# Patient Record
Sex: Female | Born: 1960 | Race: White | Hispanic: No | Marital: Married | Smoking: Never smoker
Health system: Southern US, Community
[De-identification: ages and names within clinical notes are randomized; demographics above are authoritative.]

## PROBLEM LIST (undated history)

## (undated) DIAGNOSIS — J45990 Exercise induced bronchospasm: Secondary | ICD-10-CM

## (undated) DIAGNOSIS — J329 Chronic sinusitis, unspecified: Secondary | ICD-10-CM

## (undated) DIAGNOSIS — J302 Other seasonal allergic rhinitis: Secondary | ICD-10-CM

## (undated) DIAGNOSIS — G43909 Migraine, unspecified, not intractable, without status migrainosus: Secondary | ICD-10-CM

## (undated) DIAGNOSIS — T7840XA Allergy, unspecified, initial encounter: Secondary | ICD-10-CM

## (undated) HISTORY — DX: Allergy, unspecified, initial encounter: T78.40XA

## (undated) HISTORY — DX: Exercise induced bronchospasm: J45.990

## (undated) HISTORY — DX: Chronic sinusitis, unspecified: J32.9

## (undated) HISTORY — DX: Other seasonal allergic rhinitis: J30.2

## (undated) HISTORY — DX: Migraine, unspecified, not intractable, without status migrainosus: G43.909

---

## 1989-05-02 HISTORY — PX: GUM SURGERY: SHX658

## 1995-05-03 HISTORY — PX: TUBAL LIGATION: SHX77

## 2009-10-19 ENCOUNTER — Ambulatory Visit: Payer: Self-pay | Admitting: Family Medicine

## 2010-12-15 ENCOUNTER — Ambulatory Visit: Payer: Self-pay | Admitting: Family Medicine

## 2014-11-12 DIAGNOSIS — D239 Other benign neoplasm of skin, unspecified: Secondary | ICD-10-CM

## 2014-11-12 HISTORY — DX: Other benign neoplasm of skin, unspecified: D23.9

## 2017-10-27 ENCOUNTER — Other Ambulatory Visit: Payer: Self-pay | Admitting: *Deleted

## 2017-10-27 DIAGNOSIS — Z1231 Encounter for screening mammogram for malignant neoplasm of breast: Secondary | ICD-10-CM

## 2017-11-14 ENCOUNTER — Encounter: Payer: Self-pay | Admitting: Radiology

## 2017-11-14 ENCOUNTER — Ambulatory Visit
Admission: RE | Admit: 2017-11-14 | Discharge: 2017-11-14 | Disposition: A | Discharge status: Home / Self Care | Source: Ambulatory Visit | Attending: *Deleted | Admitting: *Deleted

## 2017-11-14 DIAGNOSIS — Z1231 Encounter for screening mammogram for malignant neoplasm of breast: Secondary | ICD-10-CM | POA: Diagnosis present

## 2018-08-06 ENCOUNTER — Ambulatory Visit: Admitting: Primary Care

## 2018-08-22 ENCOUNTER — Telehealth: Payer: Self-pay | Admitting: Primary Care

## 2018-08-22 NOTE — Telephone Encounter (Signed)
Called to discuss new patient appointment. Left voicemail asking patient to call office. If she calls back, she can be transferred to me.

## 2018-08-23 ENCOUNTER — Ambulatory Visit

## 2018-08-24 ENCOUNTER — Telehealth: Payer: Self-pay | Admitting: Radiology

## 2018-08-24 NOTE — Telephone Encounter (Signed)
LVM for pt to call for appt information, drive up and cvid screen

## 2018-08-28 ENCOUNTER — Ambulatory Visit

## 2018-08-28 ENCOUNTER — Encounter: Payer: Self-pay | Admitting: Primary Care

## 2018-08-28 ENCOUNTER — Ambulatory Visit (INDEPENDENT_AMBULATORY_CARE_PROVIDER_SITE_OTHER): Admitting: Primary Care

## 2018-08-28 DIAGNOSIS — J4520 Mild intermittent asthma, uncomplicated: Secondary | ICD-10-CM | POA: Diagnosis not present

## 2018-08-28 DIAGNOSIS — J329 Chronic sinusitis, unspecified: Secondary | ICD-10-CM | POA: Insufficient documentation

## 2018-08-28 DIAGNOSIS — J45909 Unspecified asthma, uncomplicated: Secondary | ICD-10-CM | POA: Insufficient documentation

## 2018-08-28 NOTE — Assessment & Plan Note (Signed)
Predominantly exercise induced. Infrequent use of her inhaler outside of exercise. Appears well.  Continue to monitor.

## 2018-08-28 NOTE — Patient Instructions (Signed)
Please schedule a physical with me for later this Summer. You may also schedule a lab only appointment 3-4 days prior. We will discuss your lab results in detail during your physical.  It was a pleasure to meet you today! Please don't hesitate to call or message me with any questions. Welcome to [facility]!

## 2018-08-28 NOTE — Assessment & Plan Note (Signed)
Overall improved over time, now typically gets two infections annually. Following with ENT and compliant to Qnasl.

## 2018-08-28 NOTE — Progress Notes (Signed)
Subjective:    Patient ID: Kelli Snyder, female    DOB: November 14, 1960, 58 y.o.   MRN: [id number]  HPI  Virtual Visit via Video Note  I connected with Kelli Snyder on 08/28/18 at  3:20 PM EDT by a video enabled telemedicine application and verified that I am speaking with the correct person using two identifiers.   I discussed the limitations of evaluation and management by telemedicine and the availability of in person appointments. The patient expressed understanding and agreed to proceed. She is at home and I am in the office.  History of Present Illness:  Kelli Snyder is a 58 year old female who presents today to establish care and discuss the problems mentioned below. Will obtain/review records.  1) Asthma/Seasonal Allergies: Currently managed on albuterol HFA PRN, QNasl 80 mcg. Asthma is exercise induced mostly, specifically in really hot or cold environments.   2) Recurrent Sinusitis: Currently following with ENT and is managed on Qnasal 80 mcg with significant improvement. Her last sinus infection was several months ago which typically occur twice annually. She cannot take antihistamines as they cause the reverse effect.   Observations/Objective:  Alert and oriented. Appears well. No distress. Mood appropriate.   Assessment and Plan:  See problem based charting.  Follow Up Instructions:  Please schedule a physical with me for later this Summer. You may also schedule a lab only appointment 3-4 days prior. We will discuss your lab results in detail during your physical.  It was a pleasure to meet you today! Please don't hesitate to call or message me with any questions. Welcome to [facility]!    I discussed the assessment and treatment plan with the patient. The patient was provided an opportunity to ask questions and all were answered. The patient agreed with the plan and demonstrated an understanding of the instructions.   The patient was advised to call back or seek  an in-person evaluation if the symptoms worsen or if the condition fails to improve as anticipated.     [name], NP    Review of Systems  HENT: Negative for sinus pressure.   Respiratory: Negative for shortness of breath.   Cardiovascular: Negative for chest pain.  Allergic/Immunologic: Positive for environmental allergies.  Neurological: Negative for headaches.       Past Medical History:  Diagnosis Date  . Chronic seasonal allergic rhinitis   . Chronic sinusitis   . Exercise-induced asthma   . Migraines      Social History   Socioeconomic History  . Marital status: Married    Spouse name: Not on file  . Number of children: Not on file  . Years of education: Not on file  . Highest education level: Not on file  Occupational History  . Not on file  Social Needs  . Financial resource strain: Not on file  . Food insecurity:    Worry: Not on file    Inability: Not on file  . Transportation needs:    Medical: Not on file    Non-medical: Not on file  Tobacco Use  . Smoking status: Never Smoker  . Smokeless tobacco: Never Used  Substance and Sexual Activity  . Alcohol use: Yes  . Drug use: Not on file  . Sexual activity: Not on file  Lifestyle  . Physical activity:    Days per week: Not on file    Minutes per session: Not on file  . Stress: Not on file  Relationships  . Social connections:  Talks on phone: Not on file    Gets together: Not on file    Attends religious service: Not on file    Active member of club or organization: Not on file    Attends meetings of clubs or organizations: Not on file    Relationship status: Not on file  . Intimate partner violence:    Fear of current or ex partner: Not on file    Emotionally abused: Not on file    Physically abused: Not on file    Forced sexual activity: Not on file  Other Topics Concern  . 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    Married.   2 children.    Past Surgical History:  Procedure Laterality Date  . GUM SURGERY  1991  . TUBAL LIGATION  1997    Family History  Problem Relation Age of Onset  . Breast cancer Mother 59       On HRT prior to diagnosis   . Hyperlipidemia Mother   . Melanoma Mother   . Hypertension Paternal Grandmother   . Cancer Paternal Grandfather     Allergies  Allergen Reactions  . Sulfa Antibiotics Swelling    Swelling and itching of lips, denies breathing problems Swelling and itching of lips, denies breathing problems     Current Outpatient Medications on File Prior to Visit  Medication Sig Dispense Refill  . ASHWAGANDHA PO Take by mouth.    . diphenhydrAMINE HCl, Sleep, (ZZZQUIL PO) Take by mouth.    . Epinastine HCl 0.05 % ophthalmic solution Place 1 drop into both eyes as needed (Allergies).     . MELATONIN PO Take by mouth.    . Multiple Vitamin (MULTIVITAMIN) tablet Take 1 tablet by mouth daily.     [name] PROAIR HFA 108 (90 Base) MCG/ACT inhaler Inhale 1-2 puffs into the lungs every 6 (six) hours as needed.     Norvel Richards 80 MCG/ACT AERS Place 1 spray into the nose daily.      No current facility-administered medications on file prior to visit.     BP 124/76   Pulse 88   Temp 98.4 F (36.9 C) (Tympanic)   Ht 5\' 4"  (1.626 m)   Wt 120 lb 5 oz (54.6 kg)   SpO2 97%   BMI 20.65 kg/m    Objective:   Physical Exam  Constitutional: She is oriented to person, place, and time. She appears well-nourished.  Neck: Neck supple.  Respiratory: Effort normal.  Neurological: She is alert and oriented to person, place, and time.  Skin: Skin is dry.  Psychiatric: She has a normal mood and affect.           Assessment & Plan:

## 2018-09-18 ENCOUNTER — Ambulatory Visit (INDEPENDENT_AMBULATORY_CARE_PROVIDER_SITE_OTHER): Admitting: Primary Care

## 2018-09-18 ENCOUNTER — Ambulatory Visit: Payer: Self-pay

## 2018-09-18 ENCOUNTER — Encounter: Payer: Self-pay | Admitting: Primary Care

## 2018-09-18 DIAGNOSIS — R21 Rash and other nonspecific skin eruption: Secondary | ICD-10-CM | POA: Insufficient documentation

## 2018-09-18 NOTE — Assessment & Plan Note (Signed)
Earlier symptoms could have been Covid-19, hard to tell. Given that these symptoms have resolved I wouldn't recommend Covid-19 testing at this point.  She would be an interesting candidate for antibody testing in the future but not now as antibody testing isn't specific for Covid-19. Discussed to notify me if she notices the rash/brusing return to her foot, also if she developed symptoms of Covid-19.

## 2018-09-18 NOTE — Patient Instructions (Signed)
Please notify me if you notice changes to your foot as discuss.  Also notify me if you develop fevers, cough, shortness of breath, loss of taste/smell.  It was a pleasure to see you today! [name], NP-C

## 2018-09-18 NOTE — Telephone Encounter (Signed)
Attempted to contact pt to discuss symptoms; no answer; will route to office for final disposition.

## 2018-09-18 NOTE — Progress Notes (Signed)
Subjective:    Patient ID: Kelli Snyder, female    DOB: [birth year], 58 y.o.   MRN: [id number]  HPI  Virtual Visit via Video Note  I connected with [name] on 09/18/18 at  2:20 PM EDT by a video enabled telemedicine application and verified that I am speaking with the correct person using two identifiers.  Location: Patient: Home Provider: Office   I discussed the limitations of evaluation and management by telemedicine and the availability of in person appointments. The patient expressed understanding and agreed to proceed.  History of Present Illness:  Kelli Snyder is a 58 year old female with a history of asthma and chronic recurrent sinusitis who presents today to discuss potential Covid-19 symptoms.  In early May she began with a mild headache that lasted for a few days, then she developed diarrhea which lasted for several days, and then began to notice a rash to her right dorsal foot. The rash was red and itchy for a few days, then changed into dark bruising like color. Her rash has since nearly resolved as well as her headaches and diarrhea.   She denies fevers, shortness of breath, cough, injury/trauma to her foot, insect bites. She has not been out of her home much, only to get essential items.    Observations/Objective:  Alert and oriented. Appears well, not sickly. No distress. No cough. Speaking in complete sentences. No bruising noted to dorsal foot.  Assessment and Plan:  Earlier symptoms could have been Covid-19, hard to tell. Given that these symptoms have resolved I wouldn't recommend Covid-19 testing at this point.  She would be an interesting candidate for antibody testing in the future but not now as antibody testing isn't specific for Covid-19. Discussed to notify me if she notices the rash/brusing return to her foot, also if she developed symptoms of Covid-19.  Follow Up Instructions:  Please notify me if you notice changes to your foot as  discuss.  Also notify me if you develop fevers, cough, shortness of breath, loss of taste/smell.  It was a pleasure to see you today! [name], NP-C    I discussed the assessment and treatment plan with the patient. The patient was provided an opportunity to ask questions and all were answered. The patient agreed with the plan and demonstrated an understanding of the instructions.   The patient was advised to call back or seek an in-person evaluation if the symptoms worsen or if the condition fails to improve as anticipated.     [name], NP    Review of Systems  Gastrointestinal: Positive for diarrhea.  Skin: Positive for color change and rash. Negative for wound.  Neurological: Positive for headaches.       Past Medical History:  Diagnosis Date   Chronic seasonal allergic rhinitis    Chronic sinusitis    Exercise-induced asthma    Migraines      Social History   Socioeconomic History   Marital status: Married    Spouse name: Not on file   Number of children: Not on file   Years of education: Not on file   Highest education level: Not on file  Occupational History   Not on file  Social Needs   Financial resource strain: Not on file   Food insecurity:    Worry: Not on file    Inability: Not on file   Transportation needs:    Medical: Not on file    Non-medical: Not on file  Tobacco Use   Smoking status: Never Smoker   Smokeless tobacco: Never Used  Substance and Sexual Activity   Alcohol use: Yes   Drug use: Not on file   Sexual activity: Not on file  Lifestyle   Physical activity:    Days per week: Not on file    Minutes per session: Not on file   Stress: Not on file  Relationships   Social connections:    Talks on phone: Not on file    Gets together: Not on file    Attends religious service: Not on file    Active member of club or organization: Not on file    Attends meetings of clubs or organizations: Not on file      Relationship status: Not on file   Intimate partner violence:    Fear of current or ex partner: Not on file    Emotionally abused: Not on file    Physically abused: Not on file    Forced sexual activity: Not on file  Other Topics Concern   Not on file  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   Married.   2 children.    Past Surgical History:  Procedure Laterality Date   GUM SURGERY  1991   TUBAL LIGATION  1997    Family History  Problem Relation Age of Onset   Breast cancer Mother 82       On HRT prior to diagnosis    Hyperlipidemia Mother    Melanoma Mother    Hypertension Paternal Grandmother    Cancer Paternal Grandfather     Allergies  Allergen Reactions   Sulfa Antibiotics Swelling    Swelling and itching of lips, denies breathing problems Swelling and itching of lips, denies breathing problems     Current Outpatient Medications on File Prior to Visit  Medication Sig Dispense Refill   ASHWAGANDHA PO Take by mouth.     diphenhydrAMINE HCl, Sleep, (ZZZQUIL PO) Take by mouth.     Epinastine HCl 0.05 % ophthalmic solution Place 1 drop into both eyes as needed (Allergies).      MELATONIN PO Take by mouth.     Multiple Vitamin (MULTIVITAMIN) tablet Take 1 tablet by mouth daily.      PROAIR HFA 108 (90 Base) MCG/ACT inhaler Inhale 1-2 puffs into the lungs every 6 (six) hours as needed.      QNASL 80 MCG/ACT AERS Place 1 spray into the nose daily.      No current facility-administered medications on file prior to visit.     Wt 120 lb (54.4 kg)    BMI 20.60 kg/m    Objective:   Physical Exam  Constitutional: She is oriented to person, place, and time. She appears well-nourished.  Respiratory: Effort normal.  Neurological: She is alert and oriented to person, place, and time.  Skin: Skin is dry. No rash noted. No erythema.  No bruising like discoloration            Assessment & Plan:

## 2018-09-18 NOTE — Telephone Encounter (Signed)
2nd attempt to return call to pt.  Left vm. To call office to discuss her questions.

## 2018-09-18 NOTE — Telephone Encounter (Signed)
rec'd vm on COVID 19 Question line on 5/18 @ 4:09 PM.  Reported she had questions about testing for the Coronavirus and also antibody testing.  Also stated she had questions about a rash on her foot.  Attempted to call pt. at this time.  Left vm to return call to the office to discuss her questions.

## 2018-09-18 NOTE — Telephone Encounter (Signed)
Noted, will evaluate. 

## 2018-09-18 NOTE — Telephone Encounter (Signed)
I spoke with pt and see appt notes; pt has virtual appt with Gentry Fitz NP today at 2:20.

## 2018-12-14 ENCOUNTER — Ambulatory Visit (INDEPENDENT_AMBULATORY_CARE_PROVIDER_SITE_OTHER): Admitting: Family Medicine

## 2018-12-14 ENCOUNTER — Encounter: Payer: Self-pay | Admitting: Family Medicine

## 2018-12-14 ENCOUNTER — Other Ambulatory Visit: Payer: Self-pay

## 2018-12-14 VITALS — BP 100/68 | HR 73 | Temp 98.5°F | Ht 64.0 in | Wt 123.0 lb

## 2018-12-14 DIAGNOSIS — M7042 Prepatellar bursitis, left knee: Secondary | ICD-10-CM | POA: Diagnosis not present

## 2018-12-14 NOTE — Progress Notes (Signed)
Subjective:    Patient ID: Kelli Snyder, female    DOB: 06/08/1960, 58 y.o.   MRN: [id number]  HPI Chief Complaint  Patient presents with  . Joint Swelling    Pt c/o left knee swelling x 6 weeks. Pt states that she was painting about 33mo ago and was on her knees a lot painting trim and she noticed that it started swelling and keeps getting worse. No pain associated. Pt has been icing to help relieve swelling.      This is a 58 yo female with the above complaint.  She is very active and has not had problems with her knees in the past.  She does not recall having very much pain when swelling first started following painting on her knees.  The swelling increased over the last week with increased running.  She has been wearing a compression brace while exercising and applying ice with no reduction in swelling.  The area has not been warmth and it is not painful.  She has not had to limit any of her regular activities which include teaching spinning classes at [employer].  Past Medical History:  Diagnosis Date  . Chronic seasonal allergic rhinitis   . Chronic sinusitis   . Exercise-induced asthma   . Migraines    Past Surgical History:  Procedure Laterality Date  . GUM SURGERY  1991  . TUBAL LIGATION  1997   Family History  Problem Relation Age of Onset  . Breast cancer Mother 62       On HRT prior to diagnosis   . Hyperlipidemia Mother   . Melanoma Mother   . Hypertension Paternal Grandmother   . Cancer Paternal Grandfather    Social History   Tobacco Use  . Smoking status: Never Smoker  . Smokeless tobacco: Never Used  Substance Use Topics  . Alcohol use: Yes  . Drug use: Not on file      Review of Systems Per HPI    Objective:   Physical Exam Vitals signs reviewed.  Constitutional:      Appearance: Normal appearance. She is normal weight.  HENT:     Head: Normocephalic and atraumatic.  Eyes:     Conjunctiva/sclera: Conjunctivae normal.  Cardiovascular:    Rate and Rhythm: Normal rate.  Pulmonary:     Effort: Pulmonary effort is normal.  Musculoskeletal:     Left knee: She exhibits swelling (prepatellar). She exhibits normal range of motion, no effusion, no ecchymosis, no erythema and normal alignment. No tenderness found.  Skin:    General: Skin is warm and dry.  Neurological:     Mental Status: She is alert and oriented to person, place, and time.  Psychiatric:        Mood and Affect: Mood normal.        Behavior: Behavior normal.        Thought Content: Thought content normal.        Judgment: Judgment normal.       BP 100/68 (BP Location: Left Arm, Patient Position: Sitting, Cuff Size: Normal)   Pulse 73   Temp 98.5 F (36.9 C) (Temporal)   Ht 5\' 4"  (1.626 m)   Wt 123 lb (55.8 kg)   SpO2 99%   BMI 21.11 kg/m  Wt Readings from Last 3 Encounters:  12/14/18 123 lb (55.8 kg)  09/18/18 120 lb (54.4 kg)  08/28/18 120 lb 5 oz (54.6 kg)       Assessment & Plan:  1.  Prepatellar bursitis of left knee -Provided written and verbal information regarding diagnosis and treatment. -Given that there are no signs of infection such as pain, redness, fever, we will continue to treat conservatively with more consistent compression and she can try heat. -If no improvement in 1 to 2 weeks I have encouraged her to follow-up with our sports medicine physician, Dr. Frederico Hamman Copland.  Clarene Reamer, FNP-BC  [facility]  12/14/2018 3:54 PM

## 2018-12-14 NOTE — Patient Instructions (Signed)
Good to see you today  Continue with compression during the day, off at night  If you want to schedule an appointment with Dr. Lorelei Pont (sports medicine) just call the office.   Prepatellar Bursitis  Prepatellar bursitis is inflammation of the prepatellar bursa, which is a fluid-filled sac that cushions the kneecap (patella). Prepatellar bursitis happens when fluid builds up in this sac and causes it to swell. The condition causes knee pain. What are the causes? This condition may be caused by:  Constant pressure on the knees from kneeling.  A hit to the knee.  Falling on the knee.  Infection from bacteria.  Moving the knee often in a forceful way. What increases the risk? You are more likely to develop this condition if:  You play sports that have a high risk of falling on the knee or being hit on the knee. These include football, wrestling, basketball, or soccer.  You do work in which you kneel for long periods of time, such as roofing, plumbing, or gardening.  You have another inflammatory condition, such as gout or rheumatoid arthritis. What are the signs or symptoms? The most common symptom of this condition is knee pain that gets better with rest. Other symptoms include:  Swelling on the front of the kneecap.  Warmth in the knee.  Tenderness with activity.  Redness in the knee.  Inability to bend the knee or to kneel. How is this diagnosed? This condition is diagnosed based on:  A physical exam. Your health care provider will compare your knees and check for tenderness and pain while moving your knee.  Your medical history.  Tests to check for infection. These may include blood tests and tests on the fluid in the bursa.  Imaging tests, such as X-ray, MRI, or ultrasound, to check for damage in the patella, or fluid buildup and swelling in the bursa. How is this treated? This condition may be treated by:  Resting the knee.  Putting ice on the knee.  Taking  medicines, such as: ? NSAIDs. These can help to reduce pain and swelling. ? Antibiotics. These may be needed if you have an infection. ? Steroids. These are used to reduce swelling and inflammation, and may be prescribed if other treatments are not helping.  Raising (elevating) the knee while resting.  Doing exercises to help you maintain movement (physical therapy). These may be recommended after pain and swelling improve.  Having a procedure to remove fluid from the bursa. This may be done if other treatments are not helping.  Having surgery to remove the bursa. This may be done if you have a severe infection or if the condition keeps coming back after treatment. Follow these instructions at home: Medicines  Take over-the-counter and prescription medicines only as told by your health care provider.  If you were prescribed an antibiotic medicine, take it as told by your health care provider. Do not stop taking the antibiotic even if you start to feel better. Managing pain, stiffness, and swelling   If directed, put ice on the injured area. ? Put ice in a plastic bag. ? Place a towel between your skin and the bag. ? Leave the ice on for 20 minutes, 2-3 times a day.  Elevate the injured area above the level of your heart while you are sitting or lying down. Activity  Do not use the injured limb to support your body weight until your health care provider says that you can.  Rest your knee.  Avoid activities that cause knee pain.  Return to your normal activities as told by your health care provider. Ask your health care provider what activities are safe for you.  Do exercises as told by your health care provider. General instructions  Ask your health care provider when it is safe for you to drive.  Do not use any products that contain nicotine or tobacco, such as cigarettes, e-cigarettes, and chewing tobacco. These can delay healing. If you need help quitting, ask your health  care provider.  Keep all follow-up visits as told by your health care provider. This is important. How is this prevented?  Warm up and stretch before being active.  Cool down and stretch after being active.  Give your body time to rest between periods of activity.  Maintain physical fitness, including strength and flexibility.  Be safe and responsible while being active. This will help you to avoid falls.  Wear knee pads if you have to kneel for a long period of time. Contact a health care provider if:  Your symptoms do not improve or get worse.  Your symptoms keep coming back after treatment.  You develop a fever and have warmth, redness, or swelling over your knee. Summary  Prepatellar bursitis is inflammation of the prepatellar bursa, which is a fluid-filled sac that cushions the kneecap (patella).  This condition may be caused by injury or constant pressure on the knee. It may also be caused by an infection from bacteria.  Symptoms of this condition include pain, swelling, warmth, and tenderness in the knee.  Follow instructions from your health care provider about taking medicines, resting, and doing activities.  Contact your health care provider if your symptoms do not improve, get worse, or keep coming back after treatment. This information is not intended to replace advice given to you by your health care provider. Make sure you discuss any questions you have with your health care provider. Document Released: 04/18/2005 Document Revised: 08/10/2018 Document Reviewed: 06/28/2018 Elsevier Patient Education  2020 [employer].

## 2019-01-10 ENCOUNTER — Telehealth: Payer: Self-pay | Admitting: Primary Care

## 2019-01-10 NOTE — Telephone Encounter (Signed)
error 

## 2019-01-11 ENCOUNTER — Ambulatory Visit (INDEPENDENT_AMBULATORY_CARE_PROVIDER_SITE_OTHER): Admitting: Family Medicine

## 2019-01-11 ENCOUNTER — Encounter: Payer: Self-pay | Admitting: Family Medicine

## 2019-01-11 DIAGNOSIS — R0981 Nasal congestion: Secondary | ICD-10-CM | POA: Insufficient documentation

## 2019-01-11 DIAGNOSIS — J329 Chronic sinusitis, unspecified: Secondary | ICD-10-CM

## 2019-01-11 MED ORDER — CEFDINIR 300 MG PO CAPS: 300.0000 mg | ORAL_CAPSULE | Freq: Two times a day (BID) | ORAL | 0 refills | Status: DC

## 2019-01-11 NOTE — Assessment & Plan Note (Signed)
No known exposure to [place].. doubt this is cause of symptoms but she works as Pharmacist, hospital.  If no improvement in 48 hours.[name] go for COVID19 testing.  Home isolation until then.

## 2019-01-11 NOTE — Progress Notes (Signed)
VIRTUAL VISIT Due to national recommendations of social distancing due to [place] 19, a virtual visit is felt to be most appropriate for this patient at this time.   I connected with the patient on 01/11/19 at  3:20 PM EDT by virtual telehealth platform and verified that I am speaking with the correct person using two identifiers.   I discussed the limitations, risks, security and privacy concerns of performing an evaluation and management service by  virtual telehealth platform and the availability of in person appointments. I also discussed with the patient that there may be a patient responsible charge related to this service. The patient expressed understanding and agreed to proceed.  Patient location: Home Provider Location: [facility] Participants: Eliezer Lofts and [name]   Chief Complaint  Patient presents with  . Sinusitis    History of Present Illness: Sinusitis This is a new problem. The current episode started 1 to 4 weeks ago (2 weeks). The problem has been gradually worsening since onset. There has been no fever. Her pain is at a severity of 8/10. The pain is moderate. Associated symptoms include congestion, headaches and sinus pressure. Pertinent negatives include no chills, coughing, ear pain, shortness of breath, sneezing or sore throat. ( ttp over forehead and under left eye   Purulent discharge from nose.) Treatments tried:  saline, advil, sudafed. The treatment provided mild relief.  history of recurrent sinusitis.[name] usually occurs twice  Yearly  She has been tested for allergies... Zyrtec claritin makes her worsen. Uses Qnasal   has seen ENT in past.  COVID 19 screen No recent travel or known exposure to [place] The patient denies respiratory symptoms of COVID 19 at this time.  The importance of social distancing was discussed today.   Review of Systems  Constitutional: Negative for chills.  HENT: Positive for congestion and sinus pressure. Negative  for ear pain, sneezing and sore throat.   Respiratory: Negative for cough and shortness of breath.   Neurological: Positive for headaches.      Past Medical History:  Diagnosis Date  . Chronic seasonal allergic rhinitis   . Chronic sinusitis   . Exercise-induced asthma   . Migraines     reports that she has never smoked. She has never used smokeless tobacco. She reports current alcohol use.   Current Outpatient Medications:  .  diphenhydrAMINE HCl, Sleep, (ZZZQUIL PO), Take by mouth., Disp: , Rfl:  .  Epinastine HCl 0.05 % ophthalmic solution, Place 1 drop into both eyes as needed (Allergies). , Disp: , Rfl:  .  MELATONIN PO, Take by mouth., Disp: , Rfl:  .  Multiple Vitamin (MULTIVITAMIN) tablet, Take 1 tablet by mouth daily. , Disp: , Rfl:  .  PROAIR HFA 108 (90 Base) MCG/ACT inhaler, Inhale 1-2 puffs into the lungs every 6 (six) hours as needed. , Disp: , Rfl:  .  QNASL 80 MCG/ACT AERS, Place 1 spray into the nose daily. , Disp: , Rfl:    Observations/Objective: Height 5\' 4"  (1.626 m).  Physical Exam  Physical Exam Constitutional:      General: The patient is not in acute distress. Pulmonary:     Effort: Pulmonary effort is normal. No respiratory distress.  Neurological:     Mental Status: The patient is alert and oriented to person, place, and time.  Psychiatric:        Mood and Affect: Mood normal.        Behavior: Behavior normal.   Assessment and Plan Chronic  recurrent sinusitis Likely current bacterial infeciton. Treat with cefdinir x 7 days as this was effective at last OV with ENT.  Conitnue nasal saline, NSAIDS and Qnasal,Cannot tolerate antihistamines.  Head congestion No known exposure to [place].. doubt this is cause of symptoms but she works as Pharmacist, hospital.  If no improvement in 48 hours.[name] go for COVID19 testing.  Home isolation until then.      I discussed the assessment and treatment plan with the patient. The patient was provided an opportunity to ask  questions and all were answered. The patient agreed with the plan and demonstrated an understanding of the instructions.   The patient was advised to call back or seek an in-person evaluation if the symptoms worsen or if the condition fails to improve as anticipated.     Eliezer Lofts, MD

## 2019-01-11 NOTE — Assessment & Plan Note (Addendum)
Likely current bacterial infeciton. Treat with cefdinir x 7 days as this was effective at last OV with ENT.  Conitnue nasal saline, NSAIDS and Qnasal,Cannot tolerate antihistamines.

## 2019-01-15 ENCOUNTER — Ambulatory Visit: Admitting: Family Medicine

## 2019-05-09 ENCOUNTER — Ambulatory Visit: Attending: Internal Medicine

## 2019-05-09 DIAGNOSIS — Z20822 Contact with and (suspected) exposure to covid-19: Secondary | ICD-10-CM

## 2019-05-11 LAB — NOVEL CORONAVIRUS, NAA: SARS-CoV-2, NAA: NOT DETECTED

## 2019-05-13 ENCOUNTER — Ambulatory Visit: Attending: Internal Medicine

## 2019-05-13 DIAGNOSIS — Z20822 Contact with and (suspected) exposure to covid-19: Secondary | ICD-10-CM

## 2019-05-14 LAB — NOVEL CORONAVIRUS, NAA: SARS-CoV-2, NAA: NOT DETECTED

## 2019-06-24 ENCOUNTER — Encounter: Payer: Self-pay | Admitting: Family Medicine

## 2019-06-24 ENCOUNTER — Telehealth: Payer: Self-pay

## 2019-06-24 ENCOUNTER — Ambulatory Visit (INDEPENDENT_AMBULATORY_CARE_PROVIDER_SITE_OTHER): Admitting: Family Medicine

## 2019-06-24 ENCOUNTER — Other Ambulatory Visit: Payer: Self-pay

## 2019-06-24 VITALS — BP 100/70 | HR 82 | Temp 99.3°F | Ht 64.0 in | Wt 124.5 lb

## 2019-06-24 DIAGNOSIS — J0100 Acute maxillary sinusitis, unspecified: Secondary | ICD-10-CM

## 2019-06-24 DIAGNOSIS — M7042 Prepatellar bursitis, left knee: Secondary | ICD-10-CM | POA: Diagnosis not present

## 2019-06-24 MED ORDER — METHYLPREDNISOLONE ACETATE 40 MG/ML IJ SUSP
20.0000 mg | Freq: Once | INTRAMUSCULAR | Status: AC
  Administered 2019-06-24: 20 mg via INTRA_ARTICULAR

## 2019-06-24 MED ORDER — AMOXICILLIN-POT CLAVULANATE 875-125 MG PO TABS: 1.0000 | ORAL_TABLET | Freq: Two times a day (BID) | ORAL | 0 refills | Status: AC

## 2019-06-24 NOTE — Telephone Encounter (Signed)
Patient already picked up her ABX and confirmed to [facility] that she has a SULFA allergy and not a penicillin allergy.

## 2019-06-24 NOTE — Progress Notes (Signed)
Kelli Snyder T. Kelli Barrie, MD Primary Care and Sports Medicine [facility] at [facility] [place], [address] Phone: [phone]   FAX: [phone]  Kelli Snyder - 59 y.o. female   MRN [id number]   Date of Birth: Sep 19, 1960  Visit Date: 06/24/2019   PCP: [name], NP   Referred by: [name], NP  Chief Complaint  Patient presents with   Knee Pain    Left   Sinusitis    This visit occurred during the SARS-CoV-2 public health emergency.  Safety protocols were in place, including screening questions prior to the visit, additional usage of staff PPE, and extensive cleaning of exam room while observing appropriate contact time as indicated for disinfecting solutions.   Subjective:   Kelli Snyder is a 59 y.o. very pleasant female patient with Body mass index is 21.37 kg/m. who presents with the following:  This is the first time I evaluated the patient.  It looks as if she was seen in our office 6 months ago with some prepatellar bursitis. She does not have any pain in the knee.  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  She has had her prepatellar bursitis for approximately 6 months.  She has tried some compression without much significant improvement at all.  She also has some pain in her left-sided maxillary sinus, and she has had pain now for 14 days it has been escalating.  Over-the-counter cold medication has not helped at all    Review of Systems is noted in the HPI, as appropriate   Objective:   BP 100/70    Pulse 82    Temp 99.3 F (37.4 C) (Temporal)    Ht 5\' 4"  (1.626 m)    Wt 124 lb 8 oz (56.5 kg)    SpO2 97%    BMI 21.37 kg/m   The left knee there is an obvious prepatellar bursitis.  It is not red or warm.  Full extension and flexion to approximately 104 degrees.  Supple to varus valgus stress.  ACL PCL intact.  Meniscal testing causes no  pain.   Gen: WDWN, NAD; alert,appropriate and cooperative throughout exam  HEENT: Normocephalic and atraumatic. Throat clear, w/o exudate, no LAD, R TM clear, L TM - good landmarks, No fluid present.  Minimal rhinnorhea.  Left frontal and maxillary sinuses: Tender at the left maxillary Right frontal and maxillary sinuses: Nontender  Neck: No ant or post LAD CV: RRR, No M/G/R Pulm: Breathing comfortably in no resp distress. no w/c/r Psych: full affect, pleasant   Radiology: No results found.  Assessment and Plan:     ICD-10-CM   1. Prepatellar bursitis of left knee  M70.42 methylPREDNISolone acetate (DEPO-MEDROL) injection 20 mg  2. Acute non-recurrent maxillary sinusitis  J01.00    Sinusitis, treated with Augmentin.  Obvious prepatellar bursitis.  6 months in length of time.    Aspiration/Injection Procedure Note Kelli Fest Apr 12, 1961 Date of procedure: 06/24/2019  Procedure: Large Joint Aspiration / Injection of Knee, Prepatellar Bursa, L Indications: Pain  Procedure Details Verbal consent was obtained. Risks, benefits, and alternatives discussed. Potential complications including loss of pigment, atrophy, and infection were discussed. We discussed that there is a higher risk of infection with corticosteroid injected into this superficial bursa.  In this particular case, where the patient will be on 10 days of oral Augmentin at 875 mg daily, I think that  this will dramatically decrease potential risk for infection.  She was prepped with Chloraprep and Ethyl Chloride used for anesthesia. Under sterile conditions, the effected prepatellar bursa was aspirated with an 18 gauge needle. 6 cc amount of serosanguineous fluid was aspirated.  Half a cc of 1% lidocaine as well as half a cc of Depo-Medrol 40 mg was injected post aspiration.  Subsequently, the patient knee was dressed with a Band-Aid and a Ace bandage was used to wrap with compression.  She has multiple neoprene sleeves  at home, and I recommended she do this for 7 days.  Recommended not spinning, kickboxing or intense aerobic exercise with the left lower extremity to increase the likelihood of this in resolution.  A compression bandage was applied and the patient was instructed to continue for 72 hours.   Follow-up: No follow-ups on file.  Meds ordered this encounter  Medications   amoxicillin-clavulanate (AUGMENTIN) 875-125 MG tablet    Sig: Take 1 tablet by mouth 2 (two) times daily for 10 days.    Dispense:  20 tablet    Refill:  0   methylPREDNISolone acetate (DEPO-MEDROL) injection 20 mg   Medications Discontinued During This Encounter  Medication Reason   cefdinir (OMNICEF) 300 MG capsule Completed Course   MELATONIN PO Duplicate   No orders of the defined types were placed in this encounter.   Signed,  Maud Deed. Ashaya Raftery, MD   Outpatient Encounter Medications as of 06/24/2019  Medication Sig   diphenhydrAMINE HCl, Sleep, (ZZZQUIL PO) Take by mouth.   Epinastine HCl 0.05 % ophthalmic solution Place 1 drop into both eyes as needed (Allergies).    Multiple Vitamin (MULTIVITAMIN) tablet Take 1 tablet by mouth daily.    PROAIR HFA 108 (90 Base) MCG/ACT inhaler Inhale 1-2 puffs into the lungs every 6 (six) hours as needed.    QNASL 80 MCG/ACT AERS Place 1 spray into the nose daily.    amoxicillin-clavulanate (AUGMENTIN) 875-125 MG tablet Take 1 tablet by mouth 2 (two) times daily for 10 days.   [DISCONTINUED] cefdinir (OMNICEF) 300 MG capsule Take 1 capsule (300 mg total) by mouth 2 (two) times daily.   [DISCONTINUED] MELATONIN PO Take by mouth.   [EXPIRED] methylPREDNISolone acetate (DEPO-MEDROL) injection 20 mg    No facility-administered encounter medications on file as of 06/24/2019.

## 2019-06-24 NOTE — Telephone Encounter (Signed)
[facility] [address] [facility] [employer] left v/m that according to their records pt is allergic to penicillin. [facility] request cb to see if provider wants to change abx from Augmentin.[facility] request cb. I was unable to speak with pt on either contact #. FYI to Dr Lorelei Pont and Butch Penny CMA.

## 2019-07-05 ENCOUNTER — Other Ambulatory Visit: Payer: Self-pay | Admitting: Primary Care

## 2019-07-05 DIAGNOSIS — Z0000 Encounter for general adult medical examination without abnormal findings: Secondary | ICD-10-CM

## 2019-07-12 ENCOUNTER — Other Ambulatory Visit

## 2019-07-18 ENCOUNTER — Other Ambulatory Visit: Payer: Self-pay

## 2019-07-18 ENCOUNTER — Other Ambulatory Visit (INDEPENDENT_AMBULATORY_CARE_PROVIDER_SITE_OTHER)

## 2019-07-18 DIAGNOSIS — Z0000 Encounter for general adult medical examination without abnormal findings: Secondary | ICD-10-CM | POA: Diagnosis not present

## 2019-07-18 LAB — CBC
HCT: 43.4 % (ref 36.0–46.0)
Hemoglobin: 14.2 g/dL (ref 12.0–15.0)
MCHC: 32.7 g/dL (ref 30.0–36.0)
MCV: 92.7 fl (ref 78.0–100.0)
Platelets: 244 10*3/uL (ref 150.0–400.0)
RBC: 4.68 Mil/uL (ref 3.87–5.11)
RDW: 14 % (ref 11.5–15.5)
WBC: 4.8 10*3/uL (ref 4.0–10.5)

## 2019-07-18 LAB — LIPID PANEL
Cholesterol: 253 mg/dL — ABNORMAL HIGH (ref 0–200)
HDL: 94.4 mg/dL (ref >39.00)
LDL Cholesterol: 145 mg/dL — ABNORMAL HIGH (ref 0–99)
NonHDL: 158.75
Total CHOL/HDL Ratio: 3
Triglycerides: 71 mg/dL (ref 0.0–149.0)
VLDL: 14.2 mg/dL (ref 0.0–40.0)

## 2019-07-18 LAB — COMPREHENSIVE METABOLIC PANEL
ALT: 19 U/L (ref 0–35)
AST: 26 U/L (ref 0–37)
Albumin: 4.3 g/dL (ref 3.5–5.2)
Alkaline Phosphatase: 81 U/L (ref 39–117)
BUN: 23 mg/dL (ref 6–23)
CO2: 30 mEq/L (ref 19–32)
Calcium: 9.4 mg/dL (ref 8.4–10.5)
Chloride: 99 mEq/L (ref 96–112)
Creatinine, Ser: 0.93 mg/dL (ref 0.40–1.20)
GFR: 62.29 mL/min (ref >60.00)
Glucose, Bld: 97 mg/dL (ref 70–99)
Potassium: 4.1 mEq/L (ref 3.5–5.1)
Sodium: 132 mEq/L — ABNORMAL LOW (ref 135–145)
Total Bilirubin: 0.7 mg/dL (ref 0.2–1.2)
Total Protein: 7.1 g/dL (ref 6.0–8.3)

## 2019-07-18 LAB — HEMOGLOBIN A1C: Hgb A1c MFr Bld: 5.8 % (ref 4.6–6.5)

## 2019-07-19 ENCOUNTER — Encounter: Admitting: Primary Care

## 2019-07-23 ENCOUNTER — Encounter: Payer: Self-pay | Admitting: Primary Care

## 2019-07-23 ENCOUNTER — Other Ambulatory Visit: Payer: Self-pay

## 2019-07-23 ENCOUNTER — Ambulatory Visit (INDEPENDENT_AMBULATORY_CARE_PROVIDER_SITE_OTHER): Admitting: Primary Care

## 2019-07-23 VITALS — BP 96/58 | HR 70 | Temp 96.1°F | Ht 64.0 in | Wt 124.4 lb

## 2019-07-23 DIAGNOSIS — R7303 Prediabetes: Secondary | ICD-10-CM | POA: Diagnosis not present

## 2019-07-23 DIAGNOSIS — Z0000 Encounter for general adult medical examination without abnormal findings: Secondary | ICD-10-CM | POA: Diagnosis not present

## 2019-07-23 DIAGNOSIS — J4520 Mild intermittent asthma, uncomplicated: Secondary | ICD-10-CM

## 2019-07-23 DIAGNOSIS — Z1231 Encounter for screening mammogram for malignant neoplasm of breast: Secondary | ICD-10-CM

## 2019-07-23 DIAGNOSIS — Z1283 Encounter for screening for malignant neoplasm of skin: Secondary | ICD-10-CM

## 2019-07-23 DIAGNOSIS — Z0001 Encounter for general adult medical examination with abnormal findings: Secondary | ICD-10-CM | POA: Insufficient documentation

## 2019-07-23 DIAGNOSIS — J302 Other seasonal allergic rhinitis: Secondary | ICD-10-CM

## 2019-07-23 DIAGNOSIS — E785 Hyperlipidemia, unspecified: Secondary | ICD-10-CM | POA: Insufficient documentation

## 2019-07-23 MED ORDER — PROAIR HFA 108 (90 BASE) MCG/ACT IN AERS: 1.0000 | INHALATION_SPRAY | Freq: Four times a day (QID) | RESPIRATORY_TRACT | 0 refills | Status: DC | PRN

## 2019-07-23 MED ORDER — EPINASTINE HCL 0.05 % OP SOLN: 1.0000 [drp] | OPHTHALMIC | 0 refills | Status: DC | PRN

## 2019-07-23 MED ORDER — QNASL 80 MCG/ACT NA AERS: 1.0000 | INHALATION_SPRAY | Freq: Every day | NASAL | 0 refills | Status: DC

## 2019-07-23 NOTE — Assessment & Plan Note (Signed)
LDL of 145 with ASCVD risk score of 1%. HDL of 94. Continue to monitor. Continue with healthy lifestyle.

## 2019-07-23 NOTE — Assessment & Plan Note (Signed)
Immunizations UTD. Pap smear UTD, due in 2022. Colon cancer screening UTD, due in 2022. Encouraged a healthy diet, regular exercise. Exam today unremarkable. Labs reviewed.

## 2019-07-23 NOTE — Patient Instructions (Addendum)
Continue exercising. You should be getting 150 minutes of moderate intensity exercise weekly.  Continue to work on a healthy diet Ensure you are consuming 64 ounces of water daily.  Call the [facility] to schedule your mammogram.   You will be contacted regarding your referral to dermatology.  Please let us know if you have not been contacted within two weeks.   Schedule a nurse visit to complete the shingles vaccines.   Schedule a lab only visit for 4 months to repeat the blood sugar test.  It was a pleasure to see you today!   Preventive Care 18-3 Years Old, Female Preventive care refers to visits with your health care provider and lifestyle choices that can promote health and wellness. This includes:  A yearly physical exam. This may also be called an annual well check.  Regular dental visits and eye exams.  Immunizations.  Screening for certain conditions.  Healthy lifestyle choices, such as eating a healthy diet, getting regular exercise, not using drugs or products that contain nicotine and tobacco, and limiting alcohol use. What can I expect for my preventive care visit? Physical exam Your health care provider will check your:  Height and weight. This may be used to calculate body mass index (BMI), which tells if you are at a healthy weight.  Heart rate and blood pressure.  Skin for abnormal spots. Counseling Your health care provider may ask you questions about your:  Alcohol, tobacco, and drug use.  Emotional well-being.  Home and relationship well-being.  Sexual activity.  Eating habits.  Work and work Statistician.  Method of birth control.  Menstrual cycle.  Pregnancy history. What immunizations do I need?  Influenza (flu) vaccine  This is recommended every year. Tetanus, diphtheria, and pertussis (Tdap) vaccine  You may need a Td booster every 10 years. Varicella (chickenpox) vaccine  You may need this if you have not been  vaccinated. Zoster (shingles) vaccine  You may need this after age 54. Measles, mumps, and rubella (MMR) vaccine  You may need at least one dose of MMR if you were born in 1957 or later. You may also need a second dose. Pneumococcal conjugate (PCV13) vaccine  You may need this if you have certain conditions and were not previously vaccinated. Pneumococcal polysaccharide (PPSV23) vaccine  You may need one or two doses if you smoke cigarettes or if you have certain conditions. Meningococcal conjugate (MenACWY) vaccine  You may need this if you have certain conditions. Hepatitis A vaccine  You may need this if you have certain conditions or if you travel or work in places where you may be exposed to hepatitis A. Hepatitis B vaccine  You may need this if you have certain conditions or if you travel or work in places where you may be exposed to hepatitis B. Haemophilus influenzae type b (Hib) vaccine  You may need this if you have certain conditions. Human papillomavirus (HPV) vaccine  If recommended by your health care provider, you may need three doses over 6 months. You may receive vaccines as individual doses or as more than one vaccine together in one shot (combination vaccines). Talk with your health care provider about the risks and benefits of combination vaccines. What tests do I need? Blood tests  Lipid and cholesterol levels. These may be checked every 5 years, or more frequently if you are over 66 years old.  Hepatitis C test.  Hepatitis B test. Screening  Lung cancer screening. You may have this  screening every year starting at age 56 if you have a 30-pack-year history of smoking and currently smoke or have quit within the past 15 years.  Colorectal cancer screening. All adults should have this screening starting at age 92 and continuing until age 48. Your health care provider may recommend screening at age 33 if you are at increased risk. You will have tests every  1-10 years, depending on your results and the type of screening test.  Diabetes screening. This is done by checking your blood sugar (glucose) after you have not eaten for a while (fasting). You may have this done every 1-3 years.  Mammogram. This may be done every 1-2 years. Talk with your health care provider about when you should start having regular mammograms. This may depend on whether you have a family history of breast cancer.  BRCA-related cancer screening. This may be done if you have a family history of breast, ovarian, tubal, or peritoneal cancers.  Pelvic exam and Pap test. This may be done every 3 years starting at age 84. Starting at age 49, this may be done every 5 years if you have a Pap test in combination with an HPV test. Other tests  Sexually transmitted disease (STD) testing.  Bone density scan. This is done to screen for osteoporosis. You may have this scan if you are at high risk for osteoporosis. Follow these instructions at home: Eating and drinking  Eat a diet that includes fresh fruits and vegetables, whole grains, lean protein, and low-fat dairy.  Take vitamin and mineral supplements as recommended by your health care provider.  Do not drink alcohol if: ? Your health care provider tells you not to drink. ? You are pregnant, may be pregnant, or are planning to become pregnant.  If you drink alcohol: ? Limit how much you have to 0-1 drink a day. ? Be aware of how much alcohol is in your drink. In the [place] one drink equals one 12 oz bottle of beer (355 mL), one 5 oz glass of wine (148 mL), or one 1 oz glass of hard liquor (44 mL). Lifestyle  Take daily care of your teeth and gums.  Stay active. Exercise for at least 30 minutes on 5 or more days each week.  Do not use any products that contain nicotine or tobacco, such as cigarettes, e-cigarettes, and chewing tobacco. If you need help quitting, ask your health care provider.  If you are sexually active,  practice safe sex. Use a condom or other form of birth control (contraception) in order to prevent pregnancy and STIs (sexually transmitted infections).  If told by your health care provider, take low-dose aspirin daily starting at age 70. What's next?  Visit your health care provider once a year for a well check visit.  Ask your health care provider how often you should have your eyes and teeth checked.  Stay up to date on all vaccines. This information is not intended to replace advice given to you by your health care provider. Make sure you discuss any questions you have with your health care provider. Document Revised: 12/28/2017 Document Reviewed: 12/28/2017 Elsevier Patient Education  2020 [employer].

## 2019-07-23 NOTE — Assessment & Plan Note (Addendum)
Noted on recent labs with A1C of 5.8.  She will work on diet and continue with regular exercise.  Repeat A1C in 4 months.

## 2019-07-23 NOTE — Assessment & Plan Note (Signed)
Doing well on current regimen, using albuterol sparingly. Continue same.

## 2019-07-23 NOTE — Progress Notes (Signed)
Subjective:    Patient ID: Kelli Snyder, female    DOB: 05-Jan-1961, 59 y.o.   MRN: [id number]  HPI  This visit occurred during the SARS-CoV-2 public health emergency.  Safety protocols were in place, including screening questions prior to the visit, additional usage of staff PPE, and extensive cleaning of exam room while observing appropriate contact time as indicated for disinfecting solutions.   Kelli Snyder is a 59 year old female who presents today for complete physical.  Immunizations: -Tetanus: Unsure, she will check records.  -Influenza: Completed last season  -Shingles: Deferred until completion of Covid-19 vaccines  -Covid-19: Completed first vaccine on 06/28/19  Diet: She endorses a fair diet. Cutting back on sugar intake. Greens, fresh fish, nuts. Exercise:  She is exercising regularly.   Eye exam: No recent exam Dental exam: Completes semi-annually   Pap Smear: Completed in 2019, follows with GYN Mammogram: Completed in 2019, due Colonoscopy: Completed Cologuard in 2019 Hep C Screen: Negative  BP Readings from Last 3 Encounters:  07/23/19 (!) 96/58  06/24/19 100/70  12/14/18 100/68   The 10-year ASCVD risk score Mikey Bussing DC Jr., et al., 2013) is: 1%   Values used to calculate the score:     Age: 49 years     Sex: Female     Is Non-Hispanic African American: No     Diabetic: No     Tobacco smoker: No     Systolic Blood Pressure: 96 mmHg     Is BP treated: No     HDL Cholesterol: 94.4 mg/dL     Total Cholesterol: 253 mg/dL   Review of Systems  Constitutional: Negative for unexpected weight change.  HENT: Negative for rhinorrhea.   Respiratory: Negative for shortness of breath.   Cardiovascular: Negative for chest pain.  Gastrointestinal: Negative for constipation and diarrhea.  Genitourinary: Negative for difficulty urinating.       Hot flashes   Musculoskeletal: Negative for arthralgias and myalgias.  Skin: Negative for rash.  Allergic/Immunologic:  Positive for environmental allergies.  Neurological: Negative for dizziness, numbness and headaches.       Occasional migraines   Psychiatric/Behavioral: The patient is not nervous/anxious.        Past Medical History:  Diagnosis Date  . Chronic seasonal allergic rhinitis   . Chronic sinusitis   . Exercise-induced asthma   . Migraines      Social History   Socioeconomic History  . Marital status: Married    Spouse name: Not on file  . Number of children: Not on file  . Years of education: Not on file  . Highest education level: Not on file  Occupational History  . Not on file  Tobacco Use  . Smoking status: Never Smoker  . Smokeless tobacco: Never Used  Substance and Sexual Activity  . Alcohol use: Yes  . Drug use: Not on file  . Sexual activity: Not on file  Other Topics Concern  . 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   Married.   2 children.   Social Determinants of Health   Financial Resource Strain:   . Difficulty of Paying Living Expenses:   Food Insecurity:   . Worried About Charity fundraiser in the Last Year:   . Arboriculturist in the Last Year:   Transportation Needs:   . Film/video editor (Medical):   [name] Lack of Transportation (Non-Medical):  Physical Activity:   . Days of Exercise per Week:   . Minutes of Exercise per Session:   Stress:   . Feeling of Stress :   Social Connections:   . Frequency of Communication with Friends and Family:   . Frequency of Social Gatherings with Friends and Family:   . Attends Religious Services:   . Active Member of Clubs or Organizations:   . Attends Archivist Meetings:   [name] Marital Status:   Intimate Partner Violence:   . Fear of Current or Ex-Partner:   . Emotionally Abused:   [name] Physically Abused:   . Sexually Abused:     Past Surgical History:  Procedure Laterality Date  . GUM SURGERY  1991  . TUBAL LIGATION  1997    Family  History  Problem Relation Age of Onset  . Breast cancer Mother 5       On HRT prior to diagnosis   . Hyperlipidemia Mother   . Melanoma Mother   . Hypertension Paternal Grandmother   . Cancer Paternal Grandfather     Allergies  Allergen Reactions  . Sulfa Antibiotics Swelling    Swelling and itching of lips, denies breathing problems Swelling and itching of lips, denies breathing problems     Current Outpatient Medications on File Prior to Visit  Medication Sig Dispense Refill  . diphenhydrAMINE HCl, Sleep, (ZZZQUIL PO) Take by mouth.    . Epinastine HCl 0.05 % ophthalmic solution Place 1 drop into both eyes as needed (Allergies).     . Multiple Vitamin (MULTIVITAMIN) tablet Take 1 tablet by mouth daily.     [name] PROAIR HFA 108 (90 Base) MCG/ACT inhaler Inhale 1-2 puffs into the lungs every 6 (six) hours as needed.     Norvel Richards 80 MCG/ACT AERS Place 1 spray into the nose daily.      No current facility-administered medications on file prior to visit.    BP (!) 96/58 (BP Location: Left Arm, Patient Position: Sitting, Cuff Size: Normal)   Pulse 70   Temp (!) 96.1 F (35.6 C) (Oral)   Ht 5\' 4"  (1.626 m)   Wt 124 lb 6.4 oz (56.4 kg)   SpO2 98%   BMI 21.35 kg/m    Objective:   Physical Exam  Constitutional: She is oriented to person, place, and time. She appears well-nourished.  HENT:  Right Ear: Tympanic membrane and ear canal normal.  Left Ear: Tympanic membrane and ear canal normal.  Mouth/Throat: Oropharynx is clear and moist.  Eyes: Pupils are equal, round, and reactive to light. EOM are normal.  Cardiovascular: Normal rate and regular rhythm.  Respiratory: Effort normal and breath sounds normal.  GI: Soft. Bowel sounds are normal. There is no abdominal tenderness.  Musculoskeletal:        General: Normal range of motion.     Cervical back: Neck supple.  Neurological: She is alert and oriented to person, place, and time. No cranial nerve deficit.  Reflex Scores:       Patellar reflexes are 2+ on the right side and 2+ on the left side. Skin: Skin is warm and dry.  Psychiatric: She has a normal mood and affect.           Assessment & Plan:

## 2019-07-24 MED ORDER — EPINASTINE HCL 0.05 % OP SOLN: 1.0000 [drp] | Freq: Two times a day (BID) | OPHTHALMIC | 0 refills | Status: DC | PRN

## 2019-07-24 NOTE — Addendum Note (Signed)
Addended by: Kris Mouton on: 07/24/2019 12:56 PM   Modules accepted: Orders

## 2019-07-30 ENCOUNTER — Telehealth: Payer: Self-pay | Admitting: Primary Care

## 2019-07-30 NOTE — Telephone Encounter (Signed)
Per DPR, left detail message of Kate Clark's comments for patient. 

## 2019-07-30 NOTE — Telephone Encounter (Signed)
Patient called today She had her 2nd covid vaccine on 3/26. She received the Moderna    Also, patient stated that on Sunday and Monday she had a migraine. And Saturday her eye started to swell. Patient said she has been keeping an eye on it, and it is still a little swollen today. Patient is not sure if this could be related to her getting the covid vaccine and would like to know what you suggest

## 2019-07-30 NOTE — Telephone Encounter (Signed)
Noted, immunization updated.  Please thank her for the update. Recommend cool compresses, try Benadryl for potential allergic reaction. As long as swelling is improving then okay to monitor.  If any green drainage, pain, continued swelling without improvement then please have her come see me.

## 2019-07-31 NOTE — Telephone Encounter (Signed)
[place] Night - Client Nonclinical Telephone Record AccessNurse Client [place] Night - Client Client Site [place] Physician Alma Friendly - NP Contact Type Call Who Is Calling Patient / Member / Family / Caregiver Caller Name [place] Phone Number [phone] Patient Name Kelli Snyder Patient DOB 10/07/1960 Call Type Message Only Information Provided Reason for Call Request for General Office Information Initial Comment Caller states she called yesterday about swollen eye and headache. Additional Comment Caller denied triage. Office hours provided. Disp. Time Disposition Final User 07/31/2019 7:59:54 AM General Information Provided Yes Peggye Fothergill Call Closed By: Peggye Fothergill Transaction Date/Time: 07/31/2019 7:56:47 AM (ET)

## 2019-07-31 NOTE — Telephone Encounter (Signed)
Can I see her virtually? I need some additional information.

## 2019-07-31 NOTE — Telephone Encounter (Signed)
Called pt to schedule. She needed late morning due to work. She is scheduled for 10:40 tomorrow.

## 2019-07-31 NOTE — Telephone Encounter (Signed)
Patient called back today. She stated she had a sinus infection a couple weeks ago and wonders if it is not gone. She stated that there is no drainage from the eye or redness in the eye. Patient stated that there is some swelling around the nose area.  Patient stated she continues to have headaches.   She would like your suggestions.

## 2019-08-01 ENCOUNTER — Telehealth (INDEPENDENT_AMBULATORY_CARE_PROVIDER_SITE_OTHER): Admitting: Primary Care

## 2019-08-01 ENCOUNTER — Other Ambulatory Visit: Payer: Self-pay

## 2019-08-01 DIAGNOSIS — J302 Other seasonal allergic rhinitis: Secondary | ICD-10-CM | POA: Diagnosis not present

## 2019-08-01 MED ORDER — FLUTICASONE PROPIONATE 50 MCG/ACT NA SUSP: 1.0000 | Freq: Two times a day (BID) | NASAL | 0 refills | Status: DC

## 2019-08-01 NOTE — Patient Instructions (Signed)
Nasal Congestion/Ear Pressure/Sinus Pressure: Try using Flonase (fluticasone) nasal spray. Instill 1 spray in each nostril twice daily.   You can take Tylenol for your headache.   Do not exceed five consecutive days of Sudafed.  Please update me next week as discussed.  It was a pleasure to see you today! [name], NP-C

## 2019-08-01 NOTE — Assessment & Plan Note (Signed)
Unclear if this is strictly allergy involvement or if she's had some reaction to her final Covid-19 vaccine.   No anaphylaxis. Appears well. Discussed to avoid use of Sudafed after five consecutive days of treatment. Add in Tylenol for the headache. Start Flonase for nasal congestion and swelling.  She will update early next week.

## 2019-08-01 NOTE — Progress Notes (Signed)
Subjective:    Patient ID: Kelli Snyder, female    DOB: 08/06/1960, 59 y.o.   MRN: [id number]  HPI  Virtual Visit via Video Note  I connected with Kelli Snyder on 08/01/19 at 10:40 AM EDT by a video enabled telemedicine application and verified that I am speaking with the correct person using two identifiers.  Location: Patient: Home Provider: Office   I discussed the limitations of evaluation and management by telemedicine and the availability of in person appointments. The patient expressed understanding and agreed to proceed.  History of Present Illness:  Kelli Snyder is a 59 year old female with a history of asthma, chronic recurrent sinusitis, prediabetes, hyperlipidemia who presents today with a chief complaint of eye swelling.  She had her second Moderna Covid-19 vaccine five days ago, had mild nausea, brain fog fatigue later that day. The following day she experienced a migraine with nausea and also noticed swelling to the right side of the nasal cavity near the corder of her right eye.  She took one Benadryl without improvement, has then taken three days of Sudafed with some improvement. She denies drainage, visual changes, watery eyes. She does have rhinorrhea, headache, and nasal congestion. Overall feels well except for her headache.   Observations/Objective:  Alert and oriented. Appears well, not sickly. No distress. Speaking in complete sentences. Mild swelling to right side of nose near the corner of her right eye.  Assessment and Plan:  Unclear if this is strictly allergy involvement or if she's had some reaction to her final Covid-19 vaccine.   No anaphylaxis. Appears well. Discussed to avoid use of Sudafed after five consecutive days of treatment. Add in Tylenol for the headache. Start Flonase for nasal congestion and swelling.  She will update early next week.  Follow Up Instructions:  Nasal Congestion/Ear Pressure/Sinus Pressure: Try using Flonase  (fluticasone) nasal spray. Instill 1 spray in each nostril twice daily.   You can take Tylenol for your headache.   Do not exceed five consecutive days of Sudafed.  Please update me next week as discussed.  It was a pleasure to see you today! [name], NP-C    I discussed the assessment and treatment plan with the patient. The patient was provided an opportunity to ask questions and all were answered. The patient agreed with the plan and demonstrated an understanding of the instructions.   The patient was advised to call back or seek an in-person evaluation if the symptoms worsen or if the condition fails to improve as anticipated.    [name], NP    Review of Systems  Constitutional: Negative for chills, fatigue and fever.  HENT: Positive for congestion and rhinorrhea.   Respiratory: Negative for cough.   Allergic/Immunologic: Positive for environmental allergies.  Neurological: Positive for headaches.       Past Medical History:  Diagnosis Date  . Chronic seasonal allergic rhinitis   . Chronic sinusitis   . Exercise-induced asthma   . Migraines      Social History   Socioeconomic History  . Marital status: Married    Spouse name: Not on file  . Number of children: Not on file  . Years of education: Not on file  . Highest education level: Not on file  Occupational History  . Not on file  Tobacco Use  . Smoking status: Never Smoker  . Smokeless tobacco: Never Used  Substance and Sexual Activity  . Alcohol use: Yes  . Drug use: Not on  file  . Sexual activity: Not on file  Other Topics Concern  . 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   Married.   2 children.   Social Determinants of Health   Financial Resource Strain:   . Difficulty of Paying Living Expenses:   Food Insecurity:   . Worried About Charity fundraiser in the Last Year:   . Arboriculturist in the Last Year:     Transportation Needs:   . Film/video editor (Medical):   [name] Lack of Transportation (Non-Medical):   Physical Activity:   . Days of Exercise per Week:   . Minutes of Exercise per Session:   Stress:   . Feeling of Stress :   Social Connections:   . Frequency of Communication with Friends and Family:   . Frequency of Social Gatherings with Friends and Family:   . Attends Religious Services:   . Active Member of Clubs or Organizations:   . Attends Archivist Meetings:   [name] Marital Status:   Intimate Partner Violence:   . Fear of Current or Ex-Partner:   . Emotionally Abused:   [name] Physically Abused:   . Sexually Abused:     Past Surgical History:  Procedure Laterality Date  . GUM SURGERY  1991  . TUBAL LIGATION  1997    Family History  Problem Relation Age of Onset  . Breast cancer Mother 6       On HRT prior to diagnosis   . Hyperlipidemia Mother   . Melanoma Mother   . Hypertension Paternal Grandmother   . Cancer Paternal Grandfather     Allergies  Allergen Reactions  . Sulfa Antibiotics Swelling    Swelling and itching of lips, denies breathing problems Swelling and itching of lips, denies breathing problems     Current Outpatient Medications on File Prior to Visit  Medication Sig Dispense Refill  . diphenhydrAMINE HCl, Sleep, (ZZZQUIL PO) Take by mouth.    . Epinastine HCl 0.05 % ophthalmic solution Place 1 drop into both eyes 2 (two) times daily as needed (Allergies). 10 mL 0  . Multiple Vitamin (MULTIVITAMIN) tablet Take 1 tablet by mouth daily.     [name] PROAIR HFA 108 (90 Base) MCG/ACT inhaler Inhale 1-2 puffs into the lungs every 6 (six) hours as needed for wheezing or shortness of breath. 8 g 0  . QNASL 80 MCG/ACT AERS Place 1 spray into the nose daily. As needed for allergies. 8.7 g 0   No current facility-administered medications on file prior to visit.    There were no vitals taken for this visit.   Objective:   Physical Exam   Constitutional: She is oriented to person, place, and time. She appears well-nourished.  Respiratory: Effort normal.  Neurological: She is alert and oriented to person, place, and time.           Assessment & Plan:

## 2019-09-03 ENCOUNTER — Telehealth: Payer: Self-pay

## 2019-09-03 NOTE — Telephone Encounter (Signed)
Pt left v/m wanting to know if she could get a CDC card with her covid vaccine information on it. Pt got covid vaccines at [employer]. I spoke with office administrator and Leafy Ro suggested contacting [facility] where got vaccine and if they could not assist pt then pt could contact [facility] Dept to see if they could transfer info to a CDC card for pt. Pt said [facility] does not have access to [facility] cards and pt will ck with [place] Dept.

## 2019-09-19 ENCOUNTER — Ambulatory Visit: Admitting: Dermatology

## 2019-10-04 ENCOUNTER — Other Ambulatory Visit: Payer: Self-pay | Admitting: Primary Care

## 2019-10-04 DIAGNOSIS — J4520 Mild intermittent asthma, uncomplicated: Secondary | ICD-10-CM

## 2019-10-04 DIAGNOSIS — J302 Other seasonal allergic rhinitis: Secondary | ICD-10-CM

## 2019-10-22 ENCOUNTER — Telehealth: Payer: Self-pay | Admitting: Primary Care

## 2019-10-22 DIAGNOSIS — J4520 Mild intermittent asthma, uncomplicated: Secondary | ICD-10-CM

## 2019-10-22 DIAGNOSIS — J302 Other seasonal allergic rhinitis: Secondary | ICD-10-CM

## 2019-10-22 NOTE — Telephone Encounter (Signed)
Patient called in regards to her scripts. She stated she tried to request a refill on her medications and they stated that the scripts were not current and declined the refill. Patient thought at her last visit the scripts were sent over but wanted to check .    She stated that she is needing the Fluticasone and epinastine eye drops       [facility] [place]

## 2019-10-23 MED ORDER — QNASL 80 MCG/ACT NA AERS: 1.0000 | INHALATION_SPRAY | Freq: Every day | NASAL | 0 refills | Status: DC

## 2019-10-23 MED ORDER — FLUTICASONE PROPIONATE 50 MCG/ACT NA SUSP: 1.0000 | Freq: Two times a day (BID) | NASAL | 1 refills | Status: DC

## 2019-10-23 NOTE — Telephone Encounter (Signed)
Left detail message for patient regarding request.   Epinastine eye drop was sent to [facility] along with inhaler on 10/04/2019 (left message for patient to check with [facility])  I have sent refill of Flonase as request to [facility].

## 2019-10-23 NOTE — Telephone Encounter (Signed)
Patient left another voicemail stating that she needs a refill on her Qnasal spray and flonase sent to [facility]. Patient stated that she will check with [facility] about the eye drops.

## 2019-10-23 NOTE — Telephone Encounter (Signed)
Pt left v/m; pt received v/m from [facility] and wanted to let Vallarie Mare know the rx were correct [facility].

## 2019-10-23 NOTE — Telephone Encounter (Signed)
Noted, Flonase was sent this morning, qnasal sent just now. Both sent to [facility].  Have her update [place] if she continues to run into problems.

## 2019-10-24 ENCOUNTER — Other Ambulatory Visit: Payer: Self-pay

## 2019-10-24 ENCOUNTER — Ambulatory Visit (INDEPENDENT_AMBULATORY_CARE_PROVIDER_SITE_OTHER): Admitting: Dermatology

## 2019-10-24 DIAGNOSIS — D229 Melanocytic nevi, unspecified: Secondary | ICD-10-CM

## 2019-10-24 DIAGNOSIS — Z1283 Encounter for screening for malignant neoplasm of skin: Secondary | ICD-10-CM

## 2019-10-24 DIAGNOSIS — D1800 Hemangioma unspecified site: Secondary | ICD-10-CM

## 2019-10-24 DIAGNOSIS — L689 Hypertrichosis, unspecified: Secondary | ICD-10-CM | POA: Diagnosis not present

## 2019-10-24 DIAGNOSIS — D225 Melanocytic nevi of trunk: Secondary | ICD-10-CM

## 2019-10-24 DIAGNOSIS — L814 Other melanin hyperpigmentation: Secondary | ICD-10-CM

## 2019-10-24 DIAGNOSIS — L821 Other seborrheic keratosis: Secondary | ICD-10-CM

## 2019-10-24 DIAGNOSIS — D485 Neoplasm of uncertain behavior of skin: Secondary | ICD-10-CM

## 2019-10-24 DIAGNOSIS — L578 Other skin changes due to chronic exposure to nonionizing radiation: Secondary | ICD-10-CM

## 2019-10-24 MED ORDER — VANIQA 13.9 % EX CREA: TOPICAL_CREAM | CUTANEOUS | 3 refills | Status: DC

## 2019-10-24 NOTE — Patient Instructions (Signed)

## 2019-10-24 NOTE — Progress Notes (Deleted)
   New Patient Visit  Subjective  Kelli Snyder is a 59 y.o. female who presents for the following: Annual Exam (no history of skin cancer or dysplastic nevi - patient has noticed ingrown hairs on her chin area and would like to discuss treatment options).  The following portions of the chart were reviewed this encounter and updated as appropriate:     Review of Systems:  No other skin or systemic complaints except as noted in HPI or Assessment and Plan.  Objective  Well appearing patient in no apparent distress; mood and affect are within normal limits.  A full examination was performed including scalp, head, eyes, ears, nose, lips, neck, chest, axillae, abdomen, back, buttocks, bilateral upper extremities, bilateral lower extremities, hands, feet, fingers, toes, fingernails, and toenails. All findings within normal limits unless otherwise noted below.   Assessment & Plan    Lentigines - Scattered tan macules - Discussed due to sun exposure - Benign, observe - Call for any changes  Seborrheic Keratoses - Stuck-on, waxy, tan-brown papules and plaques  - Discussed benign etiology and prognosis. - Observe - Call for any changes  Melanocytic Nevi - Tan-brown and/or pink-flesh-colored symmetric macules and papules - Benign appearing on exam today - Observation - Call clinic for new or changing moles - Recommend daily use of broad spectrum spf 30+ sunscreen to sun-exposed areas.   Hemangiomas - Red papules - Discussed benign nature - Observe - Call for any changes  Actinic Damage - diffuse scaly erythematous macules with underlying dyspigmentation - Recommend daily broad spectrum sunscreen SPF 30+ to sun-exposed areas, reapply every 2 hours as needed.  - Call for new or changing lesions.  Skin cancer screening performed today.   No follow-ups on file.  Luther Redo, CMA, am acting as scribe for Sarina Ser, MD .

## 2019-10-24 NOTE — Progress Notes (Signed)
New Patient Visit  Subjective  Kelli Snyder is a 59 y.o. female who presents for the following: Annual Exam (no history of skin cancer or dysplastic nevi - patient has noticed ingrown hairs on her chin area and would like to discuss treatment options). The patient presents for Total-Body Skin Exam (TBSE) for skin cancer screening and mole check.  The following portions of the chart were reviewed this encounter and updated as appropriate:  Tobacco   Allergies   Meds   Problems   Med Hx   Surg Hx   Fam Hx      Review of Systems:  No other skin or systemic complaints except as noted in HPI or Assessment and Plan.  Objective  Well appearing patient in no apparent distress; mood and affect are within normal limits.  A full examination was performed including scalp, head, eyes, ears, nose, lips, neck, chest, axillae, abdomen, back, buttocks, bilateral upper extremities, bilateral lower extremities, hands, feet, fingers, toes, fingernails, and toenails. All findings within normal limits unless otherwise noted below.  Objective  Face: History of thick dark hairs on the face  Objective  R lat braline: Repigmented brown macule   Images    Objective  R low back sacral right paraspinal: 0.6 x 0.5 cm irregular brown macule   Assessment & Plan    Hypertrichosis Face  Discussed laser hair removal procedure. Pamphlet given. Recommend not tweezing prior to procedure.  Start Vaniqa cream QHS.   Eflornithine HCl (VANIQA) 13.9 % cream - Face  Nevus R lat braline  Biopsied in [place] - benign per patient  Neoplasm of uncertain behavior of skin R low back sacral right paraspinal  Epidermal / dermal shaving  Lesion diameter (cm):  0.6 Informed consent: discussed and consent obtained   Timeout: patient name, date of birth, surgical site, and procedure verified   Procedure prep:  Patient was prepped and draped in usual sterile fashion Prep type:  Isopropyl alcohol Anesthesia: the  lesion was anesthetized in a standard fashion   Anesthetic:  1% lidocaine w/ epinephrine 1-100,000 buffered w/ 8.4% NaHCO3 Instrument used: flexible razor blade   Hemostasis achieved with: pressure, aluminum chloride and electrodesiccation   Outcome: patient tolerated procedure well   Post-procedure details: sterile dressing applied and wound care instructions given   Dressing type: bandage and petrolatum   Additional details:  Post tx defect 1.0 cm   Specimen 1 - Surgical pathology Differential Diagnosis: r/o dysplastic nevus  Check Margins: No 0.6 x 0.5 cm irregular brown macule   Lentigines - Scattered tan macules - Discussed due to sun exposure - Benign, observe - Call for any changes  Seborrheic Keratoses - Stuck-on, waxy, tan-brown papules and plaques  - Discussed benign etiology and prognosis. - Observe - Call for any changes  Melanocytic Nevi - Tan-brown and/or pink-flesh-colored symmetric macules and papules - Benign appearing on exam today - Observation - Call clinic for new or changing moles - Recommend daily use of broad spectrum spf 30+ sunscreen to sun-exposed areas.   Hemangiomas - Red papules - Discussed benign nature - Observe - Call for any changes  Actinic Damage - diffuse scaly erythematous macules with underlying dyspigmentation - Recommend daily broad spectrum sunscreen SPF 30+ to sun-exposed areas, reapply every 2 hours as needed.  - Call for new or changing lesions.  Skin cancer screening performed today.  Return in about 1 year (around 10/23/2020) for TBSE.  Luther Redo, CMA, am acting as scribe for [employer]  Nehemiah Massed, MD .  Documentation: I have reviewed the above documentation for accuracy and completeness, and I agree with the above.  Sarina Ser, MD

## 2019-10-28 ENCOUNTER — Encounter: Payer: Self-pay | Admitting: Family Medicine

## 2019-10-28 ENCOUNTER — Ambulatory Visit (INDEPENDENT_AMBULATORY_CARE_PROVIDER_SITE_OTHER): Admitting: Family Medicine

## 2019-10-28 ENCOUNTER — Telehealth: Payer: Self-pay

## 2019-10-28 ENCOUNTER — Other Ambulatory Visit: Payer: Self-pay

## 2019-10-28 VITALS — BP 134/60 | HR 87 | Temp 97.7°F | Wt 120.5 lb

## 2019-10-28 DIAGNOSIS — L03211 Cellulitis of face: Secondary | ICD-10-CM | POA: Diagnosis not present

## 2019-10-28 MED ORDER — CEPHALEXIN 500 MG PO CAPS: 500.0000 mg | ORAL_CAPSULE | Freq: Two times a day (BID) | ORAL | 0 refills | Status: DC

## 2019-10-28 NOTE — Patient Instructions (Signed)
#  Start antibiotic - Monitor for worsening redness, fever, chills, or swelling - Return in 2 days - if worsening return   Can take Ibuprofen as need for pain and swelling Can try ice

## 2019-10-28 NOTE — Telephone Encounter (Signed)
Noted.  Patient evaluated by Dr. Einar Pheasant.

## 2019-10-28 NOTE — Telephone Encounter (Signed)
Unable to reach pt by any contact #. I spoke with pts husband and he is not sure if pt went to UC or not; he thinks pt may have went to work. Pt had swelling in lt lower jaw and did not notice swelling in lips just in lower jaw area on lt. Pt has not complained to his knowledge about any difficulty breathing, no mouth, tongue or throat swelling. No CP or SOB. pts husband will let pt know if did not go to UC pt will call [place]. FYI to Gentry Fitz NP and Vallarie Mare CMA.

## 2019-10-28 NOTE — Telephone Encounter (Signed)
[place] Night - Client TELEPHONE ADVICE RECORD AccessNurse Patient Name: Kelli Snyder Gender: Female DOB: 1960/06/17 Age: 59 Y 57 M 5 D Return Phone Number: [phone] (Primary), [phone] (Secondary) [place] [address] Client [facility] Night - Client Client Site [place] Physician Alma Friendly - NP Contact Type Call Who Is Calling Patient / Member / Family / Caregiver Call Type Triage / Clinical Relationship To Patient Self Return Phone Number [phone] (Primary) Chief Complaint Facial Swelling Reason for Call Symptomatic / Request for Health Information Initial Comment Caller states wants appt for today; has swelling in lower left jaw and into lip which has gotten worse; has chronic sinus issues at times; Can that cause this type of swelling? Translation No Nurse Assessment Guidelines Guideline Title Affirmed Question Affirmed Notes Nurse Date/Time (Eastern Time) Disp. Time Eilene Ghazi Time) Disposition Final User 10/28/2019 8:16:01 AM Attempt made - message left Vallery Sa, RN, [facility] 10/28/2019 8:16:54 AM Attempt made - message left Vallery Sa, RN, [facility] 10/28/2019 8:46:14 AM Attempt made - message left Vallery Sa, RN, [facility] 10/28/2019 8:46:54 AM Attempt made - message left Vallery Sa, RN, [facility] [place] 10/28/2019 9:03:15 AM Attempt made - message left Vallery Sa, RN, [facility] [place] 10/28/2019 9:04:05 AM FINAL ATTEMPT MADE - message left Yes [place], RN, [facility] [place] Comments User: Berton Mount, RN Date/Time Eilene Ghazi Time): 10/28/2019 9:03:02 AM Person answering the secondary phone number advises to call primary number.

## 2019-10-28 NOTE — Progress Notes (Signed)
   Subjective:     Kelli Snyder is a 59 y.o. female presenting for Edema (left jaw line )     HPI   #Swelling on the left jaw - tender and warm last night - started on Friday - seemed to get worse - had had swelling related to sinus infections - dental pain: denies - did clean a barn on Friday - does not recall getting bit, and not itchy - did note some drainage previously - covered with bandaid, pus  - did recently return from [place] on 6/18   Review of Systems  Constitutional: Negative for chills and fever.     Social History   Tobacco Use  Smoking Status Never Smoker  Smokeless Tobacco Never Used        Objective:    BP Readings from Last 3 Encounters:  10/28/19 134/60  07/23/19 (!) 96/58  06/24/19 100/70   Wt Readings from Last 3 Encounters:  10/28/19 120 lb 8 oz (54.7 kg)  07/23/19 124 lb 6.4 oz (56.4 kg)  06/24/19 124 lb 8 oz (56.5 kg)    BP 134/60   Pulse 87   Temp 97.7 F (36.5 C) (Temporal)   Wt 120 lb 8 oz (54.7 kg)   SpO2 98%   BMI 20.68 kg/m    Physical Exam Constitutional:      General: She is not in acute distress.    Appearance: She is well-developed. She is not diaphoretic.  HENT:     Right Ear: External ear normal.     Left Ear: External ear normal.     Nose: Nose normal.     Mouth/Throat:     Mouth: Mucous membranes are moist.     Dentition: No dental tenderness, gingival swelling or dental abscesses.     Pharynx: Oropharynx is clear. No posterior oropharyngeal erythema.  Eyes:     Conjunctiva/sclera: Conjunctivae normal.  Cardiovascular:     Rate and Rhythm: Normal rate.  Pulmonary:     Effort: Pulmonary effort is normal.  Musculoskeletal:     Cervical back: Neck supple.  Skin:    General: Skin is warm and dry.     Capillary Refill: Capillary refill takes less than 2 seconds.     Comments: Left lower jaw: area of erythema and induration. Some skin opening but no fluctuance. Warm and tender to touch  Neurological:       Mental Status: She is alert. Mental status is at baseline.  Psychiatric:        Mood and Affect: Mood normal.        Behavior: Behavior normal.           Assessment & Plan:   Problem List Items Addressed This Visit    None    Visit Diagnoses    Cellulitis of face    -  Primary   Relevant Medications   cephALEXin (KEFLEX) 500 MG capsule     No drainable abscess on exam today, though with swelling and induration recommend 2 day follow-up to reassess. She will call if worsening symptoms/swelling/erythema   Return in about 2 days (around 10/30/2019) for skin check.  Lesleigh Noe, MD  This visit occurred during the SARS-CoV-2 public health emergency.  Safety protocols were in place, including screening questions prior to the visit, additional usage of staff PPE, and extensive cleaning of exam room while observing appropriate contact time as indicated for disinfecting solutions.

## 2019-10-30 ENCOUNTER — Encounter: Payer: Self-pay | Admitting: Dermatology

## 2019-10-30 ENCOUNTER — Telehealth: Payer: Self-pay

## 2019-10-30 NOTE — Telephone Encounter (Signed)
-----   Message from Ralene Bathe, MD sent at 10/29/2019  6:48 PM EDT ----- Skin , right low back sacral right paraspinal DYSPLASTIC [place], CLOSE TO MARGIN  Dysplastic Moderate Recheck next visit

## 2019-10-30 NOTE — Telephone Encounter (Signed)
Pt seen on 10/28/19 and still has some swelling but is better; this morning had good amt of yellow drainage from area pt described as pimple; pt has other questions and scheduled appt to see Dr Einar Pheasant as AVS instructed on 10/31/19 at 12:20. UC & ED precautions given and pt voiced understanding.Pt has no covid symptoms, no travel and no known exposure to + covid.

## 2019-10-30 NOTE — Telephone Encounter (Signed)
Patient advised of biopsy results. 

## 2019-10-30 NOTE — Telephone Encounter (Signed)
Agree with f/u appointment

## 2019-10-30 NOTE — Telephone Encounter (Signed)
LM on VM please return my call  

## 2019-10-31 ENCOUNTER — Other Ambulatory Visit: Payer: Self-pay

## 2019-10-31 ENCOUNTER — Encounter: Payer: Self-pay | Admitting: Family Medicine

## 2019-10-31 ENCOUNTER — Ambulatory Visit (INDEPENDENT_AMBULATORY_CARE_PROVIDER_SITE_OTHER): Admitting: Family Medicine

## 2019-10-31 DIAGNOSIS — L03211 Cellulitis of face: Secondary | ICD-10-CM

## 2019-10-31 MED ORDER — CEPHALEXIN 500 MG PO CAPS: 500.0000 mg | ORAL_CAPSULE | Freq: Two times a day (BID) | ORAL | 0 refills | Status: AC

## 2019-10-31 NOTE — Progress Notes (Signed)
   Subjective:     Kelli Snyder is a 59 y.o. female presenting for Follow-up (Cellulits on face)     HPI  #cellulitis - did have some additional drainage - had some left ear pain which woke her form sleep - rode horses   Review of Systems   Social History   Tobacco Use  Smoking Status Never Smoker  Smokeless Tobacco Never Used        Objective:    BP Readings from Last 3 Encounters:  10/31/19 140/74  10/28/19 134/60  07/23/19 (!) 96/58   Wt Readings from Last 3 Encounters:  10/31/19 122 lb 4 oz (55.5 kg)  10/28/19 120 lb 8 oz (54.7 kg)  07/23/19 124 lb 6.4 oz (56.4 kg)    BP 140/74   Pulse 85   Temp 98 F (36.7 C) (Temporal)   Ht 5\' 4"  (1.626 m)   Wt 122 lb 4 oz (55.5 kg)   SpO2 99%   BMI 20.98 kg/m    Physical Exam Constitutional:      General: She is not in acute distress.    Appearance: She is well-developed. She is not diaphoretic.  HENT:     Right Ear: External ear normal.     Left Ear: External ear normal.  Eyes:     Conjunctiva/sclera: Conjunctivae normal.  Cardiovascular:     Rate and Rhythm: Normal rate.  Pulmonary:     Effort: Pulmonary effort is normal.  Musculoskeletal:     Cervical back: Neck supple.  Skin:    General: Skin is warm and dry.     Capillary Refill: Capillary refill takes less than 2 seconds.     Comments: Improving cellulitis. Area of erythema and swelling improving. No area of fluctuance  Neurological:     Mental Status: She is alert. Mental status is at baseline.  Psychiatric:        Mood and Affect: Mood normal.        Behavior: Behavior normal.           Assessment & Plan:   Problem List Items Addressed This Visit    None    Visit Diagnoses    Cellulitis of face       Relevant Medications   cephALEXin (KEFLEX) 500 MG capsule     Reassurance that improving. Given holiday weekend, additional 3 days supply sent to pharmacy to continue course if not resolved by Monday   Return if symptoms  worsen or fail to improve.  Lesleigh Noe, MD  This visit occurred during the SARS-CoV-2 public health emergency.  Safety protocols were in place, including screening questions prior to the visit, additional usage of staff PPE, and extensive cleaning of exam room while observing appropriate contact time as indicated for disinfecting solutions.

## 2019-10-31 NOTE — Patient Instructions (Signed)
I will send in 3 extra days today So if not gone on Monday you can pick for 10 days total

## 2019-11-11 ENCOUNTER — Other Ambulatory Visit: Payer: Self-pay | Admitting: Primary Care

## 2019-11-11 DIAGNOSIS — R7303 Prediabetes: Secondary | ICD-10-CM

## 2019-11-20 ENCOUNTER — Ambulatory Visit
Admission: RE | Admit: 2019-11-20 | Discharge: 2019-11-20 | Disposition: A | Discharge status: Home / Self Care | Source: Ambulatory Visit | Attending: Primary Care | Admitting: Primary Care

## 2019-11-20 DIAGNOSIS — Z1231 Encounter for screening mammogram for malignant neoplasm of breast: Secondary | ICD-10-CM | POA: Diagnosis not present

## 2019-11-22 ENCOUNTER — Other Ambulatory Visit

## 2019-12-24 ENCOUNTER — Other Ambulatory Visit: Payer: Self-pay | Admitting: Primary Care

## 2019-12-24 DIAGNOSIS — J4520 Mild intermittent asthma, uncomplicated: Secondary | ICD-10-CM

## 2019-12-24 DIAGNOSIS — J302 Other seasonal allergic rhinitis: Secondary | ICD-10-CM

## 2019-12-31 ENCOUNTER — Telehealth: Admitting: Primary Care

## 2019-12-31 ENCOUNTER — Other Ambulatory Visit: Payer: Self-pay

## 2019-12-31 ENCOUNTER — Telehealth (INDEPENDENT_AMBULATORY_CARE_PROVIDER_SITE_OTHER): Admitting: Primary Care

## 2019-12-31 ENCOUNTER — Ambulatory Visit: Admitting: Primary Care

## 2019-12-31 ENCOUNTER — Encounter: Payer: Self-pay | Admitting: Primary Care

## 2019-12-31 DIAGNOSIS — G4700 Insomnia, unspecified: Secondary | ICD-10-CM | POA: Diagnosis not present

## 2019-12-31 MED ORDER — TRAZODONE HCL 50 MG PO TABS: 25.0000 mg | ORAL_TABLET | Freq: Every evening | ORAL | 0 refills | Status: DC | PRN

## 2019-12-31 NOTE — Progress Notes (Signed)
Subjective:    Patient ID: Kelli Snyder, female    DOB: [birth year], 59 y.o.   MRN: [id number]  HPI  This visit occurred during the SARS-CoV-2 public health emergency.  Safety protocols were in place, including screening questions prior to the visit, additional usage of staff PPE, and extensive cleaning of exam room while observing appropriate contact time as indicated for disinfecting solutions.   Virtual Visit via Video Note  I connected with Shannia Jacuinde on 12/31/19 at  2:40 PM EDT by a video enabled telemedicine application and verified that I am speaking with the correct person using two identifiers.  Location: Patient: Home Provider: Office   I discussed the limitations of evaluation and management by telemedicine and the availability of in person appointments. The patient expressed understanding and agreed to proceed.  History of Present Illness:  Kelli Snyder is a 59 year old female with a history of asthma, prediabetes, seasonal allergies, who presents today to discuss sleeping issues.  Several years ago she started to notice sleeping issues that began with menopausal symptoms. Over the last year she's noticed sleep disruptions and feels that she gets around 3 "good hours" of sleep. She doesn't have any difficulty falling asleep, but will wake during the night, and have trouble falling back asleep. She will typically wake around 2 am and her mind races with thoughts and ideas for work. She will wake if her husband is snoring, she hears a thunderstorm, she hears anything in the house. She does admit that she has mild anxiety, but anxiety doesn't seem to affect her much during the day.  She's tried valerian root tea, not eating before bed, not looking at a screen before bed. She cannot tolerate antihistamine medications due to side effects. She's tried all forms of Melatonin but still finds that she wakes during the night. She has a few friends on Trazodone who have told her about  good effects.    Observations/Objective:  Alert and oriented. Appears well, not sickly. No distress. Speaking in complete sentences.   Assessment and Plan:  Insomnia for the last 2 years, maintenance mostly.  No improvement with OTC treatment, she would like to try Trazodone. Agree to Trazodone, discussed side effects. Rx for Trazodone 50 mg sent to pharmacy, discussed to start with 1/2 tablet. She will update in a week or so.  Follow Up Instructions:  Start Trazodone 50 mg for sleep. Take 1/2 tablet to start, about 30 minutes prior to bedtime.  Please update me in a week or two.  It was a pleasure to see you today! [name], NP-C    I discussed the assessment and treatment plan with the patient. The patient was provided an opportunity to ask questions and all were answered. The patient agreed with the plan and demonstrated an understanding of the instructions.   The patient was advised to call back or seek an in-person evaluation if the symptoms worsen or if the condition fails to improve as anticipated.    [name], NP    Review of Systems  Respiratory: Negative for shortness of breath.   Cardiovascular: Negative for chest pain.  Psychiatric/Behavioral: Positive for sleep disturbance. The patient is nervous/anxious.        Past Medical History:  Diagnosis Date  . Chronic seasonal allergic rhinitis   . Chronic sinusitis   . Exercise-induced asthma   . Migraines      Social History   Socioeconomic History  . Marital status: Married  Spouse name: Not on file  . Number of children: Not on file  . Years of education: Not on file  . Highest education level: Not on file  Occupational History  . Not on file  Tobacco Use  . Smoking status: Never Smoker  . Smokeless tobacco: Never Used  Substance and Sexual Activity  . Alcohol use: Yes  . Drug use: Not on file  . Sexual activity: Not on file  Other Topics Concern  . 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   Married.   2 children.   Social Determinants of Health   Financial Resource Strain:   . Difficulty of Paying Living Expenses: Not on file  Food Insecurity:   . Worried About Charity fundraiser in the Last Year: Not on file  . Ran Out of Food in the Last Year: Not on file  Transportation Needs:   . Lack of Transportation (Medical): Not on file  . Lack of Transportation (Non-Medical): Not on file  Physical Activity:   . Days of Exercise per Week: Not on file  . Minutes of Exercise per Session: Not on file  Stress:   . Feeling of Stress : Not on file  Social Connections:   . Frequency of Communication with Friends and Family: Not on file  . Frequency of Social Gatherings with Friends and Family: Not on file  . Attends Religious Services: Not on file  . Active Member of Clubs or Organizations: Not on file  . Attends Archivist Meetings: Not on file  . Marital Status: Not on file  Intimate Partner Violence:   . Fear of Current or Ex-Partner: Not on file  . Emotionally Abused: Not on file  . Physically Abused: Not on file  . Sexually Abused: Not on file    Past Surgical History:  Procedure Laterality Date  . GUM SURGERY  1991  . TUBAL LIGATION  1997    Family History  Problem Relation Age of Onset  . Breast cancer Mother 71       On HRT prior to diagnosis   . Hyperlipidemia Mother   . Melanoma Mother   . Hypertension Paternal Grandmother   . Cancer Paternal Grandfather     Allergies  Allergen Reactions  . Sulfa Antibiotics Swelling    Swelling and itching of lips, denies breathing problems Swelling and itching of lips, denies breathing problems     Current Outpatient Medications on File Prior to Visit  Medication Sig Dispense Refill  . Eflornithine HCl (VANIQA) 13.9 % cream Apply to aa's face QHS 45 g 3  . Epinastine HCl 0.05 % ophthalmic solution INSTILL 1 DROP IN BOTH EYES  TWICE A DAY AS NEEDED FOR ALLERGIES 10 mL 0  . MELATONIN GUMMIES PO Take by mouth. 1 gummy at bedtime    . Multiple Vitamin (MULTIVITAMIN) tablet Take 1 tablet by mouth daily.     [name] PROAIR HFA 108 (90 Base) MCG/ACT inhaler USE 1 TO 2 INHALATIONS EVERY 6 HOURS AS NEEDED FOR WHEEZING OR SHORTNESS OF BREATH 8.5 g 0  . QNASL 80 MCG/ACT AERS USE 1 SPRAY NASALLY DAILY AS NEEDED FOR ALLERGIES 10.6 g 0   No current facility-administered medications on file prior to visit.    Ht 5\' 4"  (1.626 m)   Wt 121 lb (54.9 kg)   BMI 20.77 kg/m    Objective:   Physical Exam Pulmonary:  Effort: Pulmonary effort is normal.  Neurological:     Mental Status: She is alert and oriented to person, place, and time.  Psychiatric:        Mood and Affect: Mood normal.            Assessment & Plan:

## 2019-12-31 NOTE — Patient Instructions (Signed)
Start Trazodone 50 mg for sleep. Take 1/2 tablet to start, about 30 minutes prior to bedtime.  Please update me in a week or two.  It was a pleasure to see you today! [name], NP-C

## 2019-12-31 NOTE — Assessment & Plan Note (Signed)
Insomnia for the last 2 years, maintenance mostly.  No improvement with OTC treatment, she would like to try Trazodone. Agree to Trazodone, discussed side effects. Rx for Trazodone 50 mg sent to pharmacy, discussed to start with 1/2 tablet. She will update in a week or so.

## 2020-01-31 ENCOUNTER — Other Ambulatory Visit: Payer: Self-pay | Admitting: Primary Care

## 2020-01-31 DIAGNOSIS — G4700 Insomnia, unspecified: Secondary | ICD-10-CM

## 2020-01-31 NOTE — Telephone Encounter (Signed)
Called patient l/m to call office need to see if med helped.

## 2020-01-31 NOTE — Telephone Encounter (Signed)
Pt returned your call  She stated the meds are helping.  She doesn't take all the time.  It has be very helpful

## 2020-02-20 ENCOUNTER — Other Ambulatory Visit: Payer: Self-pay | Admitting: Primary Care

## 2020-02-20 DIAGNOSIS — J302 Other seasonal allergic rhinitis: Secondary | ICD-10-CM

## 2020-02-20 DIAGNOSIS — J4520 Mild intermittent asthma, uncomplicated: Secondary | ICD-10-CM

## 2020-03-03 ENCOUNTER — Other Ambulatory Visit: Payer: Self-pay | Admitting: Primary Care

## 2020-03-03 DIAGNOSIS — G4700 Insomnia, unspecified: Secondary | ICD-10-CM

## 2020-03-03 NOTE — Telephone Encounter (Signed)
Please see message and advise.  Thank you. Last vv 12/31/19 Last fill 02/04/20  #30/0

## 2020-03-03 NOTE — Telephone Encounter (Signed)
See my chart message

## 2020-03-04 ENCOUNTER — Other Ambulatory Visit: Payer: Self-pay

## 2020-03-04 ENCOUNTER — Ambulatory Visit (INDEPENDENT_AMBULATORY_CARE_PROVIDER_SITE_OTHER)

## 2020-03-04 DIAGNOSIS — Z23 Encounter for immunization: Secondary | ICD-10-CM

## 2020-04-06 ENCOUNTER — Encounter: Payer: Self-pay | Admitting: Family Medicine

## 2020-04-06 ENCOUNTER — Other Ambulatory Visit: Payer: Self-pay

## 2020-04-06 ENCOUNTER — Telehealth (INDEPENDENT_AMBULATORY_CARE_PROVIDER_SITE_OTHER): Admitting: Family Medicine

## 2020-04-06 VITALS — Temp 98.5°F

## 2020-04-06 DIAGNOSIS — J302 Other seasonal allergic rhinitis: Secondary | ICD-10-CM | POA: Diagnosis not present

## 2020-04-06 DIAGNOSIS — J4520 Mild intermittent asthma, uncomplicated: Secondary | ICD-10-CM | POA: Diagnosis not present

## 2020-04-06 DIAGNOSIS — J0100 Acute maxillary sinusitis, unspecified: Secondary | ICD-10-CM

## 2020-04-06 MED ORDER — AMOXICILLIN-POT CLAVULANATE 875-125 MG PO TABS: 1.0000 | ORAL_TABLET | Freq: Two times a day (BID) | ORAL | 0 refills | Status: DC

## 2020-04-06 MED ORDER — QNASL 80 MCG/ACT NA AERS: 1.0000 | INHALATION_SPRAY | Freq: Every day | NASAL | 3 refills | Status: DC | PRN

## 2020-04-06 NOTE — Progress Notes (Signed)
Virtual Visit via Video Note  I connected with Kelli Snyder on 04/06/20 at  2:00 PM EST by a video enabled telemedicine application and verified that I am speaking with the correct person using two identifiers.  Location: Patient: In her home Provider: [place] Persons participating in virtual visits: Patient, provider   I discussed the limitations of evaluation and management by telemedicine and the availability of in person appointments. The patient expressed understanding and agreed to proceed.  History of Present Illness: Chief Complaint  Patient presents with  . Sinus Problem   Kelli Snyder is a 59 year old female who presents today for virtual visit for above chief complaint.She  has a past medical history of Chronic seasonal allergic rhinitis, Chronic sinusitis, Exercise-induced asthma, and Migraines.  She is a non-smoker. She reports symptoms for the last 2 and half weeks, nasal congestion, sinus pressure worse on the left side under the left eyelid, frontal headache, green nasal drainage.  She denies ear pain, sore throat, fever, wheeze, cough.  She has a history of exercise-induced asthma and has not needed her albuterol inhaler more frequently.  She takes Qnasl daily and has been doing saline rinses.  She has not been on antibiotics in the last 90 days. She is fully vaccinated against COVID-19 including booster vaccine.   Observations/Objective: Patient is alert and answers questions appropriately.  Visible skin is unremarkable.  She sounds mildly nasally congested.  Respirations are even and unlabored without audible wheeze or witnessed cough.  There is no increased work of breathing with conversation.  Mood and affect are appropriate.  Temp 98.5 F (36.9 C)    Assessment and Plan: 1. Seasonal allergies -Patient requests refill of her nasal spray sent to her mail order pharmacy - QNASL 80 MCG/ACT AERS; Place 1 spray into the nose daily as needed.  Dispense: 10.6 g;  Refill: 3  2. Mild intermittent asthma without complication - QNASL 80 MCG/ACT AERS; Place 1 spray into the nose daily as needed.  Dispense: 10.6 g; Refill: 3  3. Acute non-recurrent maxillary sinusitis -Given duration of symptoms, will go ahead and treat for bacterial infection.  Potential side effects of medication as well as expectations of treatment reviewed with patient.  Follow-up precautions reviewed. - amoxicillin-clavulanate (AUGMENTIN) 875-125 MG tablet; Take 1 tablet by mouth 2 (two) times daily.  Dispense: 14 tablet; Refill: 0   Clarene Reamer, FNP-BC  [facility]  04/06/2020 2:16 PM   Follow Up Instructions:    I discussed the assessment and treatment plan with the patient. The patient was provided an opportunity to ask questions and all were answered. The patient agreed with the plan and demonstrated an understanding of the instructions.   The patient was advised to call back or seek an in-person evaluation if the symptoms worsen or if the condition fails to improve as anticipated.    Elby Beck, FNP

## 2020-04-13 ENCOUNTER — Telehealth: Payer: Self-pay

## 2020-04-13 NOTE — Telephone Encounter (Signed)
[place] Night - Client Nonclinical Telephone Record AccessNurse Client [place] Night - Client Client Site [facility] Physician Tor Netters- NP Contact Type Call Who Is Calling Patient / Member / Family / Caregiver Caller Name [place] Phone Number [phone] Patient Name Kelli Snyder Patient DOB 04/13/1961 Call Type Message Only Information Provided Reason for Call Request for General Office Information Initial Comment Caller states she was seen for a sinus infection . She is improving but it is still present with pressure. Does she need to get another round of antibiotics to knock it completely out? Additional Comment Provided office hours Disp. Time Disposition Final User 04/12/2020 7:48:14 PM General Information Provided Yes Vinnie Level Call Closed By: Vinnie Level Transaction Date/Time: 04/12/2020 7:45:10 PM (ET)

## 2020-04-13 NOTE — Telephone Encounter (Signed)
Unable to reach pt to see if she is running fever and where the pressure is located. Sending note to Glenda Chroman FNP who did video visit on 04/06/20.

## 2020-04-13 NOTE — Telephone Encounter (Signed)
Patient was given 7 day course of antibiotic. If improved but not resolved, I suggest she continue over the counter symptom treatment for a couple of days. Follow up if running fever, increased pain.

## 2020-04-14 DIAGNOSIS — J0100 Acute maxillary sinusitis, unspecified: Secondary | ICD-10-CM

## 2020-04-14 NOTE — Telephone Encounter (Signed)
LVM on home and cell numbers

## 2020-04-17 MED ORDER — PREDNISONE 20 MG PO TABS: ORAL_TABLET | ORAL | 0 refills | Status: DC

## 2020-04-20 NOTE — Telephone Encounter (Signed)
This has been taken care of by PCP in a mychart msg.

## 2020-04-24 ENCOUNTER — Other Ambulatory Visit: Payer: Self-pay | Admitting: Primary Care

## 2020-04-24 DIAGNOSIS — G4700 Insomnia, unspecified: Secondary | ICD-10-CM

## 2020-06-12 ENCOUNTER — Other Ambulatory Visit: Payer: Self-pay | Admitting: Primary Care

## 2020-06-12 DIAGNOSIS — G4700 Insomnia, unspecified: Secondary | ICD-10-CM

## 2020-06-15 NOTE — Telephone Encounter (Signed)
Patient will be due for CPE in late March 2022, she will need this for further refills of medications.  Please schedule.

## 2020-06-16 NOTE — Telephone Encounter (Signed)
Called patient to schedule appt. LVM to call back and schedule cpe in March

## 2020-06-18 NOTE — Telephone Encounter (Signed)
Called patient and LVM to schedule. Letter sent.

## 2020-07-26 ENCOUNTER — Other Ambulatory Visit: Payer: Self-pay | Admitting: Primary Care

## 2020-07-26 DIAGNOSIS — J4520 Mild intermittent asthma, uncomplicated: Secondary | ICD-10-CM

## 2020-07-26 DIAGNOSIS — J302 Other seasonal allergic rhinitis: Secondary | ICD-10-CM

## 2020-07-31 ENCOUNTER — Other Ambulatory Visit: Payer: Self-pay | Admitting: Primary Care

## 2020-07-31 DIAGNOSIS — G4700 Insomnia, unspecified: Secondary | ICD-10-CM

## 2020-08-18 ENCOUNTER — Ambulatory Visit (INDEPENDENT_AMBULATORY_CARE_PROVIDER_SITE_OTHER): Admitting: Primary Care

## 2020-08-18 ENCOUNTER — Other Ambulatory Visit: Payer: Self-pay

## 2020-08-18 VITALS — BP 132/66 | HR 62 | Temp 97.5°F | Ht 64.0 in | Wt 120.8 lb

## 2020-08-18 DIAGNOSIS — E785 Hyperlipidemia, unspecified: Secondary | ICD-10-CM

## 2020-08-18 DIAGNOSIS — J302 Other seasonal allergic rhinitis: Secondary | ICD-10-CM

## 2020-08-18 DIAGNOSIS — Z23 Encounter for immunization: Secondary | ICD-10-CM | POA: Diagnosis not present

## 2020-08-18 DIAGNOSIS — Z1231 Encounter for screening mammogram for malignant neoplasm of breast: Secondary | ICD-10-CM

## 2020-08-18 DIAGNOSIS — Z0000 Encounter for general adult medical examination without abnormal findings: Secondary | ICD-10-CM

## 2020-08-18 DIAGNOSIS — R7303 Prediabetes: Secondary | ICD-10-CM

## 2020-08-18 DIAGNOSIS — Z1211 Encounter for screening for malignant neoplasm of colon: Secondary | ICD-10-CM

## 2020-08-18 DIAGNOSIS — J4520 Mild intermittent asthma, uncomplicated: Secondary | ICD-10-CM

## 2020-08-18 DIAGNOSIS — Z0001 Encounter for general adult medical examination with abnormal findings: Secondary | ICD-10-CM

## 2020-08-18 DIAGNOSIS — M7042 Prepatellar bursitis, left knee: Secondary | ICD-10-CM | POA: Insufficient documentation

## 2020-08-18 DIAGNOSIS — G4700 Insomnia, unspecified: Secondary | ICD-10-CM

## 2020-08-18 LAB — COMPREHENSIVE METABOLIC PANEL
ALT: 19 U/L (ref 0–35)
AST: 27 U/L (ref 0–37)
Albumin: 4.3 g/dL (ref 3.5–5.2)
Alkaline Phosphatase: 61 U/L (ref 39–117)
BUN: 16 mg/dL (ref 6–23)
CO2: 29 mEq/L (ref 19–32)
Calcium: 9.8 mg/dL (ref 8.4–10.5)
Chloride: 102 mEq/L (ref 96–112)
Creatinine, Ser: 0.97 mg/dL (ref 0.40–1.20)
GFR: 64.91 mL/min (ref >60.00)
Glucose, Bld: 103 mg/dL — ABNORMAL HIGH (ref 70–99)
Potassium: 4.1 mEq/L (ref 3.5–5.1)
Sodium: 138 mEq/L (ref 135–145)
Total Bilirubin: 0.7 mg/dL (ref 0.2–1.2)
Total Protein: 6.9 g/dL (ref 6.0–8.3)

## 2020-08-18 LAB — CBC
HCT: 41.7 % (ref 36.0–46.0)
Hemoglobin: 13.9 g/dL (ref 12.0–15.0)
MCHC: 33.4 g/dL (ref 30.0–36.0)
MCV: 90.9 fl (ref 78.0–100.0)
Platelets: 263 10*3/uL (ref 150.0–400.0)
RBC: 4.59 Mil/uL (ref 3.87–5.11)
RDW: 14.1 % (ref 11.5–15.5)
WBC: 5.1 10*3/uL (ref 4.0–10.5)

## 2020-08-18 LAB — LIPID PANEL
Cholesterol: 263 mg/dL — ABNORMAL HIGH (ref 0–200)
HDL: 109.4 mg/dL (ref >39.00)
LDL Cholesterol: 141 mg/dL — ABNORMAL HIGH (ref 0–99)
NonHDL: 153.45
Total CHOL/HDL Ratio: 2
Triglycerides: 60 mg/dL (ref 0.0–149.0)
VLDL: 12 mg/dL (ref 0.0–40.0)

## 2020-08-18 LAB — HEMOGLOBIN A1C: Hgb A1c MFr Bld: 6 % (ref 4.6–6.5)

## 2020-08-18 NOTE — Addendum Note (Signed)
Addended by: Francella Solian on: 08/18/2020 02:35 PM   Modules accepted: Orders

## 2020-08-18 NOTE — Patient Instructions (Signed)
[facility].   You will be contacted regarding your referral to GI for the colonoscopy.  Please let us know if you have not been contacted within two weeks.   Stop by the lab prior to leaving today. I will notify you of your results once received. '  Schedule a nurse visit for 2-6 months to return for your final shingles shot.  It was a pleasure to see you today!   Preventive Care 71-58 Years Old, Female Preventive care refers to lifestyle choices and visits with your health care provider that can promote health and wellness. This includes:  A yearly physical exam. This is also called an annual wellness visit.  Regular dental and eye exams.  Immunizations.  Screening for certain conditions.  Healthy lifestyle choices, such as: ? Eating a healthy diet. ? Getting regular exercise. ? Not using drugs or products that contain nicotine and tobacco. ? Limiting alcohol use. What can I expect for my preventive care visit? Physical exam Your health care provider will check your:  Height and weight. These may be used to calculate your BMI (body mass index). BMI is a measurement that tells if you are at a healthy weight.  Heart rate and blood pressure.  Body temperature.  Skin for abnormal spots. Counseling Your health care provider may ask you questions about your:  Past medical problems.  Family's medical history.  Alcohol, tobacco, and drug use.  Emotional well-being.  Home life and relationship well-being.  Sexual activity.  Diet, exercise, and sleep habits.  Work and work Statistician.  Access to firearms.  Method of birth control.  Menstrual cycle.  Pregnancy history. What immunizations do I need? Vaccines are usually given at various ages, according to a schedule. Your health care provider will recommend vaccines for you based on your age, medical history, and lifestyle or other factors, such as travel or where you work.    What tests do I need? Blood tests  Lipid and cholesterol levels. These may be checked every 5 years, or more often if you are over 45 years old.  Hepatitis C test.  Hepatitis B test. Screening  Lung cancer screening. You may have this screening every year starting at age 55 if you have a 30-pack-year history of smoking and currently smoke or have quit within the past 15 years.  Colorectal cancer screening. ? All adults should have this screening starting at age 5 and continuing until age 17. ? Your health care provider may recommend screening at age 100 if you are at increased risk. ? You will have tests every 1-10 years, depending on your results and the type of screening test.  Diabetes screening. ? This is done by checking your blood sugar (glucose) after you have not eaten for a while (fasting). ? You may have this done every 1-3 years.  Mammogram. ? This may be done every 1-2 years. ? Talk with your health care provider about when you should start having regular mammograms. This may depend on whether you have a family history of breast cancer.  BRCA-related cancer screening. This may be done if you have a family history of breast, ovarian, tubal, or peritoneal cancers.  Pelvic exam and Pap test. ? This may be done every 3 years starting at age 75. ? Starting at age 79, this may be done every 5 years if you have a Pap test in combination with an HPV test. Other tests  STD (sexually transmitted disease)  testing, if you are at risk.  Bone density scan. This is done to screen for osteoporosis. You may have this scan if you are at high risk for osteoporosis. Talk with your health care provider about your test results, treatment options, and if necessary, the need for more tests. Follow these instructions at home: Eating and drinking  Eat a diet that includes fresh fruits and vegetables, whole grains, lean protein, and low-fat dairy products.  Take vitamin and mineral  supplements as recommended by your health care provider.  Do not drink alcohol if: ? Your health care provider tells you not to drink. ? You are pregnant, may be pregnant, or are planning to become pregnant.  If you drink alcohol: ? Limit how much you have to 0-1 drink a day. ? Be aware of how much alcohol is in your drink. In the [place] one drink equals one 12 oz bottle of beer (355 mL), one 5 oz glass of wine (148 mL), or one 1 oz glass of hard liquor (44 mL).   Lifestyle  Take daily care of your teeth and gums. Brush your teeth every morning and night with fluoride toothpaste. Floss one time each day.  Stay active. Exercise for at least 30 minutes 5 or more days each week.  Do not use any products that contain nicotine or tobacco, such as cigarettes, e-cigarettes, and chewing tobacco. If you need help quitting, ask your health care provider.  Do not use drugs.  If you are sexually active, practice safe sex. Use a condom or other form of protection to prevent STIs (sexually transmitted infections).  If you do not wish to become pregnant, use a form of birth control. If you plan to become pregnant, see your health care provider for a prepregnancy visit.  If told by your health care provider, take low-dose aspirin daily starting at age 65.  Find healthy ways to cope with stress, such as: ? Meditation, yoga, or listening to music. ? Journaling. ? Talking to a trusted person. ? Spending time with friends and family. Safety  Always wear your seat belt while driving or riding in a vehicle.  Do not drive: ? If you have been drinking alcohol. Do not ride with someone who has been drinking. ? When you are tired or distracted. ? While texting.  Wear a helmet and other protective equipment during sports activities.  If you have firearms in your house, make sure you follow all gun safety procedures. What's next?  Visit your health care provider once a year for an annual wellness  visit.  Ask your health care provider how often you should have your eyes and teeth checked.  Stay up to date on all vaccines. This information is not intended to replace advice given to you by your health care provider. Make sure you discuss any questions you have with your health care provider. Document Revised: 01/21/2020 Document Reviewed: 12/28/2017 Elsevier Patient Education  2021 [employer].

## 2020-08-18 NOTE — Assessment & Plan Note (Signed)
Mostly exercise induced, using albuterol three times weekly before exercise classes.  Continue PRN albuterol and daily Qnasl. Continue to monitor.

## 2020-08-18 NOTE — Assessment & Plan Note (Signed)
Commended her on regular exercise and a healthy diet. Repeat A1C pending

## 2020-08-18 NOTE — Assessment & Plan Note (Signed)
Worse during seasonal changes. Continue PRN albuterol, daily Qnasl.

## 2020-08-18 NOTE — Progress Notes (Signed)
Subjective:    Patient ID: Kelli Snyder, female    DOB: [birth year], 60 y.o.   MRN: [id number]  HPI  Kelli Snyder is a very pleasant 60 y.o. female who presents today for complete physical. She would also like her left patellar joint aspirated.   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 Evaluated by Dr. Lorelei Pont last year and had this drained, but the swelling returned several months ago. She denies pain and erythema. She is very active with regular exericse. At the time of her visit last year she was running quiet a bit, but has reduced this activity over the last several months. She has been up and down from her floors at home from playing with her dog and painting her base borards. She would like the site drained again.  Immunizations: -Tetanus: Unsure, within 10 years.  -Influenza: Completed this season  -Covid-19: Completed three vaccines -Shingles: Never completed   Diet: She endorses a healthy diet.  Exercise: She is exercising four days weekly   Eye exam: Due Dental exam: Completes semi-annually   Pap Smear: June 2019 Mammogram: July 2021 Colonoscopy: Cologuard   BP Readings from Last 3 Encounters:  08/18/20 132/66  10/31/19 140/74  10/28/19 134/60     Review of Systems  Constitutional: Negative for unexpected weight change.  HENT: Negative for rhinorrhea.   Eyes: Negative for visual disturbance.  Respiratory: Negative for cough and shortness of breath.   Cardiovascular: Negative for chest pain.  Gastrointestinal: Negative for constipation and diarrhea.  Genitourinary: Negative for difficulty urinating.  Musculoskeletal: Negative for arthralgias.       Prepatellar bursitis   Skin: Negative for rash.  Allergic/Immunologic: Negative for environmental allergies.  Neurological: Negative for dizziness and headaches.  Psychiatric/Behavioral: Negative for sleep disturbance.         Past Medical History:  Diagnosis Date  . Chronic seasonal  allergic rhinitis   . Chronic sinusitis   . Dysplastic nevus 11/12/2014   R mid helix - moderate  . Dysplastic nevus 10/24/2019   R low back sacral paraspinal   . Exercise-induced asthma   . Migraines     Social History   Socioeconomic History  . Marital status: Married    Spouse name: Not on file  . Number of children: Not on file  . Years of education: Not on file  . Highest education level: Not on file  Occupational History  . Not on file  Tobacco Use  . Smoking status: Never Smoker  . Smokeless tobacco: Never Used  Substance and Sexual Activity  . Alcohol use: Yes  . Drug use: Not on file  . Sexual activity: Not on file  Other Topics Concern  . 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   Married.   2 children.   Social Determinants of Health   Financial Resource Strain: Not on file  Food Insecurity: Not on file  Transportation Needs: Not on file  Physical Activity: Not on file  Stress: Not on file  Social Connections: Not on file  Intimate Partner Violence: Not on file    Past Surgical History:  Procedure Laterality Date  . GUM SURGERY  1991  . TUBAL LIGATION  1997    Family History  Problem Relation Age of Onset  . Breast cancer Mother 57       On HRT prior to diagnosis   . Hyperlipidemia Mother   . Melanoma  Mother   . Hypertension Paternal Grandmother   . Cancer Paternal Grandfather     Allergies  Allergen Reactions  . Sulfa Antibiotics Swelling    Swelling and itching of lips, denies breathing problems Swelling and itching of lips, denies breathing problems     Current Outpatient Medications on File Prior to Visit  Medication Sig Dispense Refill  . Eflornithine HCl (VANIQA) 13.9 % cream Apply to aa's face QHS 45 g 3  . Epinastine HCl 0.05 % ophthalmic solution INSTILL 1 DROP IN BOTH EYES TWICE A DAY AS NEEDED FOR ALLERGIES 10 mL 0  . MELATONIN GUMMIES PO Take by mouth. 1 gummy at  bedtime    . Multiple Vitamin (MULTIVITAMIN) tablet Take 1 tablet by mouth daily.     . predniSONE (DELTASONE) 20 MG tablet Take 2 tablets by mouth once daily for five days. 10 tablet 0  . PROAIR HFA 108 (90 Base) MCG/ACT inhaler USE 1 TO 2 INHALATIONS EVERY 6 HOURS AS NEEDED FOR WHEEZING OR SHORTNESS OF BREATH 8.5 g 0  . QNASL 80 MCG/ACT AERS Place 1 spray into the nose daily as needed. 10.6 g 3  . traZODone (DESYREL) 50 MG tablet TAKE 1 TO 2 TABLETS(50 TO 100 MG) BY MOUTH AT BEDTIME AS NEEDED FOR SLEEP 90 tablet 0   No current facility-administered medications on file prior to visit.    BP 132/66   Pulse 62   Temp (!) 97.5 F (36.4 C) (Temporal)   Ht 5\' 4"  (1.626 m)   Wt 120 lb 12 oz (54.8 kg)   SpO2 100%   BMI 20.73 kg/m  Objective:   Physical Exam HENT:     Right Ear: Tympanic membrane and ear canal normal.     Left Ear: Tympanic membrane and ear canal normal.     Nose: Nose normal.  Eyes:     Conjunctiva/sclera: Conjunctivae normal.     Pupils: Pupils are equal, round, and reactive to light.  Neck:     Thyroid: No thyromegaly.  Cardiovascular:     Rate and Rhythm: Normal rate and regular rhythm.     Heart sounds: No murmur heard.   Pulmonary:     Effort: Pulmonary effort is normal.     Breath sounds: Normal breath sounds. No rales.  Abdominal:     General: Bowel sounds are normal.     Palpations: Abdomen is soft.     Tenderness: There is no abdominal tenderness.  Musculoskeletal:        General: Normal range of motion.     Cervical back: Neck supple.     Comments: Soft fluid collection to left anterior patellar. Non tender. No erythema.  Lymphadenopathy:     Cervical: No cervical adenopathy.  Skin:    General: Skin is warm and dry.     Findings: No rash.  Neurological:     Mental Status: She is alert and oriented to person, place, and time.     Cranial Nerves: No cranial nerve deficit.     Deep Tendon Reflexes: Reflexes are normal and symmetric.   Psychiatric:        Mood and Affect: Mood normal.           Assessment & Plan:      This visit occurred during the SARS-CoV-2 public health emergency.  Safety protocols were in place, including screening questions prior to the visit, additional usage of staff PPE, and extensive cleaning of exam room while observing appropriate contact time as  indicated for disinfecting solutions.

## 2020-08-18 NOTE — Assessment & Plan Note (Signed)
Intermittent, chronic. No signs of infection on exam today.  Patient provided written consent for aspiration. Site cleaned with betadine. 4 cc of clear, light brown synovial fluid retrieved from site.  Bandage applied. Patient tolerated well.

## 2020-08-18 NOTE — Assessment & Plan Note (Signed)
Doing well on trazodone 75 mg, continue same.

## 2020-08-18 NOTE — Assessment & Plan Note (Addendum)
Tetanus UTD per patient, first Shingrix provided today.  Pap smear UTD, due again next year. Mammogram due in July 2022, order placed.   Commended her on a healthy diet and exercise. Exam today as noted. Labs pending.

## 2020-08-18 NOTE — Assessment & Plan Note (Signed)
Commended her on regular exercise and diet.  Repeat lipid panel pending.

## 2020-09-02 ENCOUNTER — Encounter: Payer: Self-pay | Admitting: *Deleted

## 2020-09-14 ENCOUNTER — Other Ambulatory Visit: Payer: Self-pay | Admitting: Primary Care

## 2020-09-14 DIAGNOSIS — J302 Other seasonal allergic rhinitis: Secondary | ICD-10-CM

## 2020-09-14 DIAGNOSIS — J4520 Mild intermittent asthma, uncomplicated: Secondary | ICD-10-CM

## 2020-09-18 ENCOUNTER — Other Ambulatory Visit: Payer: Self-pay | Admitting: Primary Care

## 2020-09-18 DIAGNOSIS — G4700 Insomnia, unspecified: Secondary | ICD-10-CM

## 2020-09-30 NOTE — Telephone Encounter (Signed)
[place] Night - Client Nonclinical Telephone Record AccessNurse Client [facility] Night - Client Client Site [place] Physician Alma Friendly - NP Contact Type Call Who Is Calling Patient / Member / Family / Caregiver Caller Name [place] Phone Number [phone] Patient Name Kelli Snyder Patient DOB [id number] Call Type Message Only Information Provided Reason for Call Request for General Office Information Initial Comment Needs to talk to the Dr, pls call back Additional Comment Wants rx called in for sinus infection Disp. Time Disposition Final User 09/30/2020 8:02:26 AM General Information Provided Yes Donato Heinz Call Closed By: Donato Heinz Transaction Date/Time: 09/30/2020 7:59:33 AM (ET)

## 2020-09-30 NOTE — Telephone Encounter (Addendum)
Unable to reach pt by any contact # and left v/m requesting cb at both numbers. I spoke with pt;  And she said that her symptoms have cleared except for pressure over lt eye. There is no more bleeding from ear. Pt said she has no fever or cough now also. Pt scheduled video visit with Dr Einar Pheasant on 10/01/20 at 10 AM. Pt will have vitals ready. UC & ED precautions given and pt voiced understanding.

## 2020-09-30 NOTE — Telephone Encounter (Signed)
Noted will see tomorrow 

## 2020-10-01 ENCOUNTER — Telehealth (INDEPENDENT_AMBULATORY_CARE_PROVIDER_SITE_OTHER): Admitting: Family Medicine

## 2020-10-01 ENCOUNTER — Encounter: Payer: Self-pay | Admitting: Family Medicine

## 2020-10-01 ENCOUNTER — Other Ambulatory Visit: Payer: Self-pay

## 2020-10-01 VITALS — Temp 97.3°F | Wt 118.0 lb

## 2020-10-01 DIAGNOSIS — J0100 Acute maxillary sinusitis, unspecified: Secondary | ICD-10-CM

## 2020-10-01 MED ORDER — AMOXICILLIN-POT CLAVULANATE 875-125 MG PO TABS: 1.0000 | ORAL_TABLET | Freq: Two times a day (BID) | ORAL | 0 refills | Status: AC

## 2020-10-01 NOTE — Progress Notes (Signed)
    I connected with Kelli Snyder on 10/01/20 at 10:00 AM EDT by video and verified that I am speaking with the correct person using two identifiers.   I discussed the limitations, risks, security and privacy concerns of performing an evaluation and management service by video and the availability of in person appointments. I also discussed with the patient that there may be a patient responsible charge related to this service. The patient expressed understanding and agreed to proceed.  Patient location: Home Provider Location: [facility] [place] [facility] Participants: Lesleigh Noe and Kaylinn Dedic   Subjective:     Kelli Snyder is a 60 y.o. female presenting for Facial Pain and Headache (3 weeks post covid )     Sinus Problem This is a new problem. The current episode started 1 to 4 weeks ago. The problem has been gradually worsening since onset. There has been no fever. The pain is moderate. Associated symptoms include congestion, headaches (worse with bending) and sinus pressure (left side). Pertinent negatives include no chills, coughing, ear pain, shortness of breath or sore throat. Past treatments include oral decongestants, acetaminophen and saline sprays (mucinex). The treatment provided mild relief.       Review of Systems  Constitutional: Negative for chills.  HENT: Positive for congestion and sinus pressure (left side). Negative for dental problem, ear pain and sore throat.   Respiratory: Negative for cough and shortness of breath.   Neurological: Positive for headaches (worse with bending).     Social History   Tobacco Use  Smoking Status Never Smoker  Smokeless Tobacco Never Used        Objective:   BP Readings from Last 3 Encounters:  08/18/20 132/66  10/31/19 140/74  10/28/19 134/60   Wt Readings from Last 3 Encounters:  10/01/20 118 lb (53.5 kg)  08/18/20 120 lb 12 oz (54.8 kg)  12/31/19 121 lb (54.9 kg)   Temp (!) 97.3 F (36.3 C) (Temporal)   Wt  118 lb (53.5 kg)   BMI 20.25 kg/m    Physical Exam Constitutional:      Appearance: Normal appearance. She is not ill-appearing.  HENT:     Head: Normocephalic and atraumatic.     Right Ear: External ear normal.     Left Ear: External ear normal.  Eyes:     Conjunctiva/sclera: Conjunctivae normal.  Pulmonary:     Effort: Pulmonary effort is normal. No respiratory distress.  Neurological:     Mental Status: She is alert. Mental status is at baseline.  Psychiatric:        Mood and Affect: Mood normal.        Behavior: Behavior normal.        Thought Content: Thought content normal.        Judgment: Judgment normal.            Assessment & Plan:   Problem List Items Addressed This Visit   None   Visit Diagnoses    Acute non-recurrent maxillary sinusitis    -  Primary   Relevant Medications   amoxicillin-clavulanate (AUGMENTIN) 875-125 MG tablet     Sinus infection Treat with abx Return if not resolving Saline spray   Return if symptoms worsen or fail to improve.  Lesleigh Noe, MD

## 2020-10-29 ENCOUNTER — Encounter: Admitting: Dermatology

## 2020-11-12 ENCOUNTER — Encounter: Admitting: Dermatology

## 2020-11-12 ENCOUNTER — Other Ambulatory Visit: Payer: Self-pay

## 2020-11-12 DIAGNOSIS — J4520 Mild intermittent asthma, uncomplicated: Secondary | ICD-10-CM

## 2020-11-12 DIAGNOSIS — J302 Other seasonal allergic rhinitis: Secondary | ICD-10-CM

## 2020-11-12 MED ORDER — QNASL 80 MCG/ACT NA AERS: 1.0000 | INHALATION_SPRAY | Freq: Every day | NASAL | 3 refills | Status: DC | PRN

## 2020-11-12 NOTE — Telephone Encounter (Signed)
Patient called stating that she needs Qnasl refill. She states [facility] contacted for refill but did not hear back. She is out of the medication.  She would like 3 months supply at a time please.  CPE 08/18/20  Last filled by Clarene Reamer on 04/06/20 # 10.6 g with 3 refills.  This could be the reason why it did not go to [facility] due to it was under Debbie's name the last time.

## 2020-11-12 NOTE — Telephone Encounter (Signed)
Left message for the patient on voicemail

## 2020-11-16 ENCOUNTER — Other Ambulatory Visit: Payer: Self-pay

## 2020-11-16 ENCOUNTER — Telehealth (INDEPENDENT_AMBULATORY_CARE_PROVIDER_SITE_OTHER): Payer: Self-pay | Admitting: Gastroenterology

## 2020-11-16 ENCOUNTER — Encounter: Admitting: Dermatology

## 2020-11-16 DIAGNOSIS — Z1211 Encounter for screening for malignant neoplasm of colon: Secondary | ICD-10-CM

## 2020-11-16 MED ORDER — PEG 3350-KCL-NA BICARB-NACL 420 G PO SOLR
4000.0000 mL | Freq: Once | ORAL | 0 refills | Status: AC
Start: 2020-11-16 — End: 2020-11-16

## 2020-11-16 NOTE — Progress Notes (Signed)
Gastroenterology Pre-Procedure Review  Request Date: 12/29/20 Requesting Physician: Dr. Allen Norris  PATIENT REVIEW QUESTIONS: The patient responded to the following health history questions as indicated:    1. Are you having any GI issues? no 2. Do you have a personal history of Polyps? no 3. Do you have a family history of Colon Cancer or Polyps? no 4. Diabetes Mellitus? no 5. Joint replacements in the past 12 months?no 6. Major health problems in the past 3 months?no 7. Any artificial heart valves, MVP, or defibrillator?no    MEDICATIONS & ALLERGIES:    Patient reports the following regarding taking any anticoagulation/antiplatelet therapy:   Plavix, Coumadin, Eliquis, Xarelto, Lovenox, Pradaxa, Brilinta, or Effient? no Aspirin? no  Patient confirms/reports the following medications:  Current Outpatient Medications  Medication Sig Dispense Refill   Epinastine HCl 0.05 % ophthalmic solution INSTILL 1 DROP IN BOTH EYES TWICE A DAY AS NEEDED FOR ALLERGIES 10 mL 0   Multiple Vitamin (MULTIVITAMIN) tablet Take 1 tablet by mouth daily.      PROAIR HFA 108 (90 Base) MCG/ACT inhaler Inhale 1-2 puffs into the lungs every 6 (six) hours as needed for wheezing or shortness of breath. 8.5 g 0   QNASL 80 MCG/ACT AERS Place 1 spray into the nose daily as needed. 10.6 g 3   traZODone (DESYREL) 50 MG tablet Take 1.5 tablets (75 mg total) by mouth at bedtime. For sleep. 135 tablet 1   No current facility-administered medications for this visit.    Patient confirms/reports the following allergies:  Allergies  Allergen Reactions   Sulfa Antibiotics Swelling    Swelling and itching of lips, denies breathing problems Swelling and itching of lips, denies breathing problems     No orders of the defined types were placed in this encounter.   AUTHORIZATION INFORMATION Primary Insurance: 1D#: Group #:  Secondary Insurance: 1D#: Group #:  SCHEDULE INFORMATION: Date: 12/29/20 Time: Location:  [place]

## 2020-11-20 ENCOUNTER — Ambulatory Visit
Admission: RE | Admit: 2020-11-20 | Discharge: 2020-11-20 | Disposition: A | Discharge status: Home / Self Care | Source: Ambulatory Visit | Attending: Primary Care | Admitting: Primary Care

## 2020-11-20 ENCOUNTER — Other Ambulatory Visit: Payer: Self-pay

## 2020-11-20 DIAGNOSIS — Z1231 Encounter for screening mammogram for malignant neoplasm of breast: Secondary | ICD-10-CM | POA: Diagnosis present

## 2020-11-23 ENCOUNTER — Other Ambulatory Visit: Payer: Self-pay | Admitting: Primary Care

## 2020-11-23 DIAGNOSIS — R928 Other abnormal and inconclusive findings on diagnostic imaging of breast: Secondary | ICD-10-CM

## 2020-11-26 ENCOUNTER — Other Ambulatory Visit: Payer: Self-pay

## 2020-11-26 ENCOUNTER — Ambulatory Visit
Admission: RE | Admit: 2020-11-26 | Discharge: 2020-11-26 | Disposition: A | Discharge status: Home / Self Care | Source: Ambulatory Visit | Attending: Primary Care | Admitting: Primary Care

## 2020-11-26 DIAGNOSIS — R928 Other abnormal and inconclusive findings on diagnostic imaging of breast: Secondary | ICD-10-CM | POA: Insufficient documentation

## 2020-12-16 ENCOUNTER — Ambulatory Visit (INDEPENDENT_AMBULATORY_CARE_PROVIDER_SITE_OTHER): Admitting: Dermatology

## 2020-12-16 ENCOUNTER — Other Ambulatory Visit: Payer: Self-pay

## 2020-12-16 DIAGNOSIS — Z1283 Encounter for screening for malignant neoplasm of skin: Secondary | ICD-10-CM

## 2020-12-16 DIAGNOSIS — D2272 Melanocytic nevi of left lower limb, including hip: Secondary | ICD-10-CM | POA: Diagnosis not present

## 2020-12-16 DIAGNOSIS — D1800 Hemangioma unspecified site: Secondary | ICD-10-CM

## 2020-12-16 DIAGNOSIS — Z86018 Personal history of other benign neoplasm: Secondary | ICD-10-CM

## 2020-12-16 DIAGNOSIS — L814 Other melanin hyperpigmentation: Secondary | ICD-10-CM

## 2020-12-16 DIAGNOSIS — D229 Melanocytic nevi, unspecified: Secondary | ICD-10-CM

## 2020-12-16 DIAGNOSIS — L821 Other seborrheic keratosis: Secondary | ICD-10-CM

## 2020-12-16 DIAGNOSIS — I8310 Varicose veins of unspecified lower extremity with inflammation: Secondary | ICD-10-CM

## 2020-12-16 DIAGNOSIS — D2271 Melanocytic nevi of right lower limb, including hip: Secondary | ICD-10-CM

## 2020-12-16 DIAGNOSIS — L578 Other skin changes due to chronic exposure to nonionizing radiation: Secondary | ICD-10-CM

## 2020-12-16 NOTE — Patient Instructions (Addendum)

## 2020-12-16 NOTE — Progress Notes (Signed)
   Follow-Up Visit   Subjective  Kelli Snyder is a 60 y.o. female who presents for the following: Annual Exam. Hx dyplastic nevi. The patient presents for Total-Body Skin Exam (TBSE) for skin cancer screening and mole check.  The following portions of the chart were reviewed this encounter and updated as appropriate:   Tobacco  Allergies  Meds  Problems  Med Hx  Surg Hx  Fam Hx     Review of Systems:  No other skin or systemic complaints except as noted in HPI or Assessment and Plan.  Objective  Well appearing patient in no apparent distress; mood and affect are within normal limits.  A full examination was performed including scalp, head, eyes, ears, nose, lips, neck, chest, axillae, abdomen, back, buttocks, bilateral upper extremities, bilateral lower extremities, hands, feet, fingers, toes, fingernails, and toenails. All findings within normal limits unless otherwise noted below.   Assessment & Plan  Nevus B/L plantar surface  Benign-appearing.  Observation.  Call clinic for new or changing moles.  Recommend daily use of broad spectrum spf 30+ sunscreen to sun-exposed areas.   Skin cancer screening  Lentigines - Scattered tan macules - Due to sun exposure - Benign-appering, observe - Recommend daily broad spectrum sunscreen SPF 30+ to sun-exposed areas, reapply every 2 hours as needed. - Call for any changes  Seborrheic Keratoses - Stuck-on, waxy, tan-brown papules and/or plaques  - Benign-appearing - Discussed benign etiology and prognosis. - Observe - Call for any changes  Melanocytic Nevi - Tan-brown and/or pink-flesh-colored symmetric macules and papules - Benign appearing on exam today - Observation - Call clinic for new or changing moles - Recommend daily use of broad spectrum spf 30+ sunscreen to sun-exposed areas.   Hemangiomas - Red papules - Discussed benign nature - Observe - Call for any changes  Actinic Damage - Chronic condition, secondary  to cumulative UV/sun exposure - diffuse scaly erythematous macules with underlying dyspigmentation - Recommend daily broad spectrum sunscreen SPF 30+ to sun-exposed areas, reapply every 2 hours as needed.  - Staying in the shade or wearing long sleeves, sun glasses (UVA+UVB protection) and wide brim hats (4-inch brim around the entire circumference of the hat) are also recommended for sun protection.  - Call for new or changing lesions.  History of Dysplastic Nevi - No evidence of recurrence today - Recommend regular full body skin exams - Recommend daily broad spectrum sunscreen SPF 30+ to sun-exposed areas, reapply every 2 hours as needed.  - Call if any new or changing lesions are noted between office visits  Varicose Veins/Spider Veins - Dilated blue, purple or red veins at the lower extremities - Reassured - Smaller vessels can be treated by sclerotherapy (a procedure to inject a medicine into the veins to make them disappear) if desired, but the treatment is not covered by insurance. Larger vessels may be covered if symptomatic and we would refer to vascular surgeon if treatment desired.  Skin cancer screening performed today.  Return in about 1 year (around 12/16/2021) for TBSE.  Luther Redo, CMA, am acting as scribe for Sarina Ser, MD .  Documentation: I have reviewed the above documentation for accuracy and completeness, and I agree with the above.  Sarina Ser, MD

## 2020-12-20 ENCOUNTER — Encounter: Payer: Self-pay | Admitting: Dermatology

## 2020-12-21 ENCOUNTER — Telehealth: Payer: Self-pay | Admitting: Primary Care

## 2020-12-21 DIAGNOSIS — T63461S Toxic effect of venom of wasps, accidental (unintentional), sequela: Secondary | ICD-10-CM

## 2020-12-21 NOTE — Telephone Encounter (Signed)
Called patient states that she was bitten by wasp on chest on Friday. Right after she took oral benadryl as well as topical benadryl. She did start having swelling on left side of face/nose and mouth. Did have some trouble breathing due to swelling. She continued oral/topical benadryl and ice over the weekend. Informed her if ever has swelling in face or trouble beating should be seen right away.    Patient is outdoors a lot and would like to know if she can get epi pen called in to keep on hand. Advised will be addressed when Anda Kraft is back in the office. She denies ANY symptoms at this time but if starts will go to ED for evaluation.

## 2020-12-21 NOTE — Telephone Encounter (Signed)
Mrs. Kelli Snyder called in and stated that she was stung a wasp and it closed your left nostril and lip and wanted to know about getting something and epipen or medication

## 2020-12-22 NOTE — Telephone Encounter (Signed)
Please call patient:  How are her symptoms today? If no better then need to add her on for virtual visit. Let me know.   If doing a lot better, will send in Epi Pen. Has she ever used one before?

## 2020-12-22 NOTE — Telephone Encounter (Signed)
Left message to return call to our office.  

## 2020-12-23 NOTE — Telephone Encounter (Signed)
Mrs. Ethell called in returning [place] phone call. Related the message, she stated that she has a little swelling on her face and chest and definitely improved.

## 2020-12-24 NOTE — Telephone Encounter (Signed)
Left message to return call to our office.  

## 2020-12-25 NOTE — Telephone Encounter (Signed)
RETURNING PHONE CALL

## 2020-12-28 ENCOUNTER — Encounter: Payer: Self-pay | Admitting: Gastroenterology

## 2020-12-29 ENCOUNTER — Encounter: Payer: Self-pay | Admitting: Gastroenterology

## 2020-12-29 ENCOUNTER — Encounter
Admission: RE | Disposition: A | Discharge status: Home / Self Care | Payer: Self-pay | Source: Home / Self Care | Attending: Gastroenterology

## 2020-12-29 ENCOUNTER — Other Ambulatory Visit: Payer: Self-pay

## 2020-12-29 ENCOUNTER — Ambulatory Visit
Admission: RE | Admit: 2020-12-29 | Discharge: 2020-12-29 | Disposition: A | Discharge status: Home / Self Care | Attending: Gastroenterology | Admitting: Gastroenterology

## 2020-12-29 ENCOUNTER — Ambulatory Visit: Admitting: Registered Nurse

## 2020-12-29 DIAGNOSIS — Z803 Family history of malignant neoplasm of breast: Secondary | ICD-10-CM | POA: Insufficient documentation

## 2020-12-29 DIAGNOSIS — Z808 Family history of malignant neoplasm of other organs or systems: Secondary | ICD-10-CM | POA: Insufficient documentation

## 2020-12-29 DIAGNOSIS — Z9851 Tubal ligation status: Secondary | ICD-10-CM | POA: Insufficient documentation

## 2020-12-29 DIAGNOSIS — Z809 Family history of malignant neoplasm, unspecified: Secondary | ICD-10-CM | POA: Diagnosis not present

## 2020-12-29 DIAGNOSIS — Z882 Allergy status to sulfonamides status: Secondary | ICD-10-CM | POA: Insufficient documentation

## 2020-12-29 DIAGNOSIS — K635 Polyp of colon: Secondary | ICD-10-CM

## 2020-12-29 DIAGNOSIS — Z79899 Other long term (current) drug therapy: Secondary | ICD-10-CM | POA: Diagnosis not present

## 2020-12-29 DIAGNOSIS — Z1211 Encounter for screening for malignant neoplasm of colon: Secondary | ICD-10-CM

## 2020-12-29 DIAGNOSIS — K5140 Inflammatory polyps of colon without complications: Secondary | ICD-10-CM | POA: Diagnosis not present

## 2020-12-29 DIAGNOSIS — K648 Other hemorrhoids: Secondary | ICD-10-CM | POA: Diagnosis not present

## 2020-12-29 DIAGNOSIS — Z8249 Family history of ischemic heart disease and other diseases of the circulatory system: Secondary | ICD-10-CM | POA: Diagnosis not present

## 2020-12-29 DIAGNOSIS — Z86018 Personal history of other benign neoplasm: Secondary | ICD-10-CM | POA: Insufficient documentation

## 2020-12-29 HISTORY — PX: COLONOSCOPY WITH PROPOFOL: SHX5780

## 2020-12-29 SURGERY — COLONOSCOPY WITH PROPOFOL
Anesthesia: General

## 2020-12-29 MED ORDER — SODIUM CHLORIDE 0.9 % IV SOLN: INTRAVENOUS | Status: DC

## 2020-12-29 MED ORDER — PROPOFOL 500 MG/50ML IV EMUL
INTRAVENOUS | Status: DC | PRN
  Administered 2020-12-29: 140 ug/kg/min via INTRAVENOUS

## 2020-12-29 MED ORDER — PROPOFOL 10 MG/ML IV BOLUS
INTRAVENOUS | Status: DC | PRN
  Administered 2020-12-29: 70 mg via INTRAVENOUS

## 2020-12-29 NOTE — Transfer of Care (Signed)
Immediate Anesthesia Transfer of Care Note  Patient: Kelli Snyder  Procedure(s) Performed: COLONOSCOPY WITH PROPOFOL  Patient Location: PACU  Anesthesia Type:General  Level of Consciousness: sedated  Airway & Oxygen Therapy: Patient Spontanous Breathing  Post-op Assessment: Report given to RN and Post -op Vital signs reviewed and stable  Post vital signs: Reviewed and stable  Last Vitals:  Vitals Value Taken Time  BP 102/66 12/29/20 0908  Temp    Pulse 56 12/29/20 0908  Resp 14 12/29/20 0908  SpO2 99 % 12/29/20 0908  Vitals shown include unvalidated device data.  Last Pain:  Vitals:   12/29/20 0818  TempSrc: Temporal  PainSc: 0-No pain         Complications: No notable events documented.

## 2020-12-29 NOTE — Anesthesia Postprocedure Evaluation (Signed)
Anesthesia Post Note  Patient: Kelli Snyder  Procedure(s) Performed: COLONOSCOPY WITH PROPOFOL  Patient location during evaluation: PACU Anesthesia Type: General Level of consciousness: awake and alert Pain management: pain level controlled Vital Signs Assessment: post-procedure vital signs reviewed and stable Respiratory status: spontaneous breathing, nonlabored ventilation, respiratory function stable and patient connected to nasal cannula oxygen Cardiovascular status: blood pressure returned to baseline and stable Postop Assessment: no apparent nausea or vomiting Anesthetic complications: no   No notable events documented.   Last Vitals:  Vitals:   12/29/20 0907 12/29/20 0927  BP: 102/66 114/71  Pulse: (!) 56   Resp: 11   Temp: 36.4 C   SpO2: 100%     Last Pain:  Vitals:   12/29/20 0927  TempSrc:   PainSc: 0-No pain                 Molli Barrows

## 2020-12-29 NOTE — Op Note (Signed)
[facility] Gastroenterology Patient Name: Kelli Snyder Procedure Date: 12/29/2020 8:33 AM MRN: [id number] Account #: [id number] Date of Birth: 12/04/1960 Admit Type: Outpatient Age: 60 Room: [facility] ENDO ROOM 4 Gender: Female Note Status: Finalized Procedure:             Colonoscopy Indications:           Screening for colorectal malignant neoplasm Providers:             Lucilla Lame MD, MD Referring MD:          [name] (Referring MD) Medicines:             Propofol per Anesthesia Complications:         No immediate complications. Procedure:             Pre-Anesthesia Assessment:                        - Prior to the procedure, a History and Physical was                         performed, and patient medications and allergies were                         reviewed. The patient's tolerance of previous                         anesthesia was also reviewed. The risks and benefits                         of the procedure and the sedation options and risks                         were discussed with the patient. All questions were                         answered, and informed consent was obtained. Prior                         Anticoagulants: The patient has taken no previous                         anticoagulant or antiplatelet agents. ASA Grade                         Assessment: II - A patient with mild systemic disease.                         After reviewing the risks and benefits, the patient                         was deemed in satisfactory condition to undergo the                         procedure.                        After obtaining informed consent, the colonoscope was  passed under direct vision. Throughout the procedure,                         the patient's blood pressure, pulse, and oxygen                         saturations were monitored continuously. The                         Colonoscope was introduced through the  anus and                         advanced to the the cecum, identified by appendiceal                         orifice and ileocecal valve. The colonoscopy was                         performed without difficulty. The patient tolerated                         the procedure well. The quality of the bowel                         preparation was excellent. Findings:      The perianal and digital rectal examinations were normal.      A 2 mm polyp was found in the descending colon. The polyp was sessile.       The polyp was removed with a cold biopsy forceps. Resection and       retrieval were complete.      Non-bleeding internal hemorrhoids were found during retroflexion. The       hemorrhoids were Grade II (internal hemorrhoids that prolapse but reduce       spontaneously). Impression:            - One 2 mm polyp in the descending colon, removed with                         a cold biopsy forceps. Resected and retrieved.                        - Non-bleeding internal hemorrhoids. Recommendation:        - Discharge patient to home.                        - Resume previous diet.                        - Continue present medications.                        - Await pathology results.                        - Repeat colonoscopy in 7 years for surveillance if                         adenomatous otherwise 10 years. Procedure Code(s):     --- Professional ---  45380, Colonoscopy, flexible; with biopsy, single or                         multiple Diagnosis Code(s):     --- Professional ---                        Z12.11, Encounter for screening for malignant neoplasm                         of colon                        K63.5, Polyp of colon CPT copyright 2019 American Medical Association. All rights reserved. The codes documented in this report are preliminary and upon coder review may  be revised to meet current compliance requirements. Lucilla Lame MD, MD 12/29/2020 9:06:07  AM This report has been signed electronically. Number of Addenda: 0 Note Initiated On: 12/29/2020 8:33 AM Scope Withdrawal Time: 0 hours 8 minutes 8 seconds  Total Procedure Duration: 0 hours 12 minutes 54 seconds  Estimated Blood Loss:  Estimated blood loss: none.      [facility]

## 2020-12-29 NOTE — Anesthesia Preprocedure Evaluation (Signed)
Anesthesia Evaluation  Patient identified by MRN, date of birth, ID band Patient awake    Reviewed: Allergy & Precautions, H&P , NPO status , Patient's Chart, lab work & pertinent test results, reviewed documented beta blocker date and time   Airway Mallampati: II   Neck ROM: full    Dental  (+) Poor Dentition   Pulmonary asthma ,    Pulmonary exam normal        Cardiovascular Exercise Tolerance: Good negative cardio ROS Normal cardiovascular exam Rhythm:regular Rate:Normal     Neuro/Psych  Headaches, negative psych ROS   GI/Hepatic negative GI ROS, Neg liver ROS,   Endo/Other  negative endocrine ROS  Renal/GU negative Renal ROS  negative genitourinary   Musculoskeletal   Abdominal   Peds  Hematology negative hematology ROS (+)   Anesthesia Other Findings Past Medical History: No date: Chronic seasonal allergic rhinitis No date: Chronic sinusitis 11/12/2014: Dysplastic nevus     Comment:  R mid helix - moderate 10/24/2019: Dysplastic nevus     Comment:  R low back sacral paraspinal  No date: Exercise-induced asthma No date: Migraines Past Surgical History: 1991: GUM SURGERY 1997: TUBAL LIGATION BMI    Body Mass Index: 20.25 kg/m     Reproductive/Obstetrics negative OB ROS                             Anesthesia Physical Anesthesia Plan  ASA: 2  Anesthesia Plan: General   Post-op Pain Management:    Induction:   PONV Risk Score and Plan:   Airway Management Planned:   Additional Equipment:   Intra-op Plan:   Post-operative Plan:   Informed Consent: I have reviewed the patients History and Physical, chart, labs and discussed the procedure including the risks, benefits and alternatives for the proposed anesthesia with the patient or authorized representative who has indicated his/her understanding and acceptance.     Dental Advisory Given  Plan Discussed with:  CRNA  Anesthesia Plan Comments:         Anesthesia Quick Evaluation

## 2020-12-29 NOTE — H&P (Signed)
Kelli Lame, MD [facility] [address] [place], [place] [address] Phone: [phone] Fax : [phone]  Primary Care Physician:  [name], NP Primary Gastroenterologist:  Dr. Allen Norris  Pre-Procedure History & Physical: HPI:  Kelli Snyder is a 60 y.o. female is here for a screening colonoscopy.   Past Medical History:  Diagnosis Date   Chronic seasonal allergic rhinitis    Chronic sinusitis    Dysplastic nevus 11/12/2014   R mid helix - moderate   Dysplastic nevus 10/24/2019   R low back sacral paraspinal    Exercise-induced asthma    Migraines     Past Surgical History:  Procedure Laterality Date   [place]    Prior to Admission medications   Medication Sig Start Date End Date Taking? Authorizing Provider  Multiple Vitamin (MULTIVITAMIN) tablet Take 1 tablet by mouth daily.    Yes [provider]  QNASL 80 MCG/ACT AERS Place 1 spray into the nose daily as needed. 11/12/20  Yes Lesleigh Noe, MD  traZODone (DESYREL) 50 MG tablet Take 1.5 tablets (75 mg total) by mouth at bedtime. For sleep. 09/18/20  Yes [name], NP  Epinastine HCl 0.05 % ophthalmic solution INSTILL 1 DROP IN BOTH EYES TWICE A DAY AS NEEDED FOR ALLERGIES 10/04/19   [name], NP  PROAIR HFA 108 [phone] Base) MCG/ACT inhaler Inhale 1-2 puffs into the lungs every 6 (six) hours as needed for wheezing or shortness of breath. 09/15/20   [name], NP    Allergies as of 11/16/2020 - Review Complete 11/16/2020  Allergen Reaction Noted   Sulfa antibiotics Swelling 10/16/2012    Family History  Problem Relation Age of Onset   Breast cancer Mother 61       On HRT prior to diagnosis    Hyperlipidemia Mother    Melanoma Mother    Hypertension Paternal Grandmother    Cancer Paternal Grandfather     Social History   Socioeconomic History   Marital status: Married    Spouse name: Not on file   Number of children: Not on file    Years of education: Not on file   Highest education level: Not on file  Occupational History   Not on file  Tobacco Use   Smoking status: Never   Smokeless tobacco: Never  Vaping Use   Vaping Use: Never used  Substance and Sexual Activity   Alcohol use: Yes   Drug use: Never   Sexual activity: Yes  Other Topics Concern   Not on file  Social History 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   Married.   2 children.   Social Determinants of Health   Financial Resource Strain: Not on file  Food Insecurity: Not on file  Transportation Needs: Not on file  Physical Activity: Not on file  Stress: Not on file  Social Connections: Not on file  Intimate Partner Violence: Not on file    Review of Systems: See HPI, otherwise negative ROS  Physical Exam: BP 139/84   Pulse (!) 48   Temp (!) 97.2 F (36.2 C) (Temporal)   Resp 16   Ht '5\' 4"'$  (1.626 m)   Wt 53.5 kg   SpO2 100%   BMI 20.25 kg/m  General:   Alert,  pleasant and cooperative in NAD Head:  Normocephalic and atraumatic. Neck:  Supple; no masses or thyromegaly. Lungs:  Clear throughout to auscultation.    Heart:  Regular rate and rhythm. Abdomen:  Soft, nontender and nondistended. Normal bowel sounds, without guarding, and without rebound.   Neurologic:  Alert and  oriented x4;  grossly normal neurologically.  Impression/Plan: Kelli Snyder is now here to undergo a screening colonoscopy.  Risks, benefits, and alternatives regarding colonoscopy have been reviewed with the patient.  Questions have been answered.  All parties agreeable.

## 2020-12-30 ENCOUNTER — Encounter: Payer: Self-pay | Admitting: Gastroenterology

## 2020-12-30 LAB — SURGICAL PATHOLOGY

## 2020-12-30 MED ORDER — EPINEPHRINE 0.3 MG/0.3ML IJ SOAJ: 0.3000 mg | INTRAMUSCULAR | 0 refills | Status: DC | PRN

## 2020-12-30 NOTE — Telephone Encounter (Signed)
Noted, Epi Pen sent electronically to [facility] on [address].

## 2020-12-30 NOTE — Telephone Encounter (Signed)
Patient called back she has no symptoms were gone when we spoke the first time. Would like to have Epi pen called into [facility] on [address].

## 2020-12-30 NOTE — Addendum Note (Signed)
Addended by: [name] on: 12/30/2020 03:49 PM   Modules accepted: Orders

## 2021-03-05 ENCOUNTER — Ambulatory Visit

## 2021-03-12 ENCOUNTER — Other Ambulatory Visit: Payer: Self-pay | Admitting: Primary Care

## 2021-03-12 DIAGNOSIS — G4700 Insomnia, unspecified: Secondary | ICD-10-CM

## 2021-03-29 ENCOUNTER — Encounter: Payer: Self-pay | Admitting: Family Medicine

## 2021-03-29 ENCOUNTER — Telehealth: Admitting: Family Medicine

## 2021-03-29 DIAGNOSIS — J0100 Acute maxillary sinusitis, unspecified: Secondary | ICD-10-CM | POA: Diagnosis not present

## 2021-03-29 DIAGNOSIS — J302 Other seasonal allergic rhinitis: Secondary | ICD-10-CM | POA: Diagnosis not present

## 2021-03-29 DIAGNOSIS — J4520 Mild intermittent asthma, uncomplicated: Secondary | ICD-10-CM

## 2021-03-29 MED ORDER — EPINASTINE HCL 0.05 % OP SOLN: OPHTHALMIC | 0 refills | Status: DC

## 2021-03-29 MED ORDER — AMOXICILLIN-POT CLAVULANATE 875-125 MG PO TABS: 1.0000 | ORAL_TABLET | Freq: Two times a day (BID) | ORAL | 0 refills | Status: AC

## 2021-03-29 MED ORDER — QNASL 80 MCG/ACT NA AERS: 1.0000 | INHALATION_SPRAY | Freq: Every day | NASAL | 0 refills | Status: DC | PRN

## 2021-03-29 NOTE — Patient Instructions (Signed)
I appreciate the opportunity to provide you with care for your health and wellness.  Take medication as directed  Happy Holidays!  Please continue to practice social distancing to keep you, your family, and our community safe.  If you must go out, please wear a mask and practice good handwashing.    Have a wonderful day. With Gratitude, Cherly Beach, DNP, AGNP-BC

## 2021-03-29 NOTE — Progress Notes (Signed)
Virtual Visit Consent   Kelli Snyder, you are scheduled for a virtual visit with a [facility] provider today.     Just as with appointments in the office, your consent must be obtained to participate.  Your consent will be active for this visit and any virtual visit you may have with one of our providers in the next 365 days.     If you have a MyChart account, a copy of this consent can be sent to you electronically.  All virtual visits are billed to your insurance company just like a traditional visit in the office.    As this is a virtual visit, video technology does not allow for your provider to perform a traditional examination.  This may limit your provider's ability to fully assess your condition.  If your provider identifies any concerns that need to be evaluated in person or the need to arrange testing (such as labs, EKG, etc.), we will make arrangements to do so.     Although advances in technology are sophisticated, we cannot ensure that it will always work on either your end or our end.  If the connection with a video visit is poor, the visit may have to be switched to a telephone visit.  With either a video or telephone visit, we are not always able to ensure that we have a secure connection.     I need to obtain your verbal consent now.   Are you willing to proceed with your visit today?    Kelli Snyder has provided verbal consent on 03/29/2021 for a virtual visit (video or telephone).   Kelli Mayo, NP   Date: 03/29/2021 1:28 PM   Virtual Visit via Video Note   I, Kelli Snyder, connected with  Kelli Snyder  ([id number], 11/24/1960) on 03/29/21 at  1:30 PM EST by a video-enabled telemedicine application and verified that I am speaking with the correct person using two identifiers.  Location: Patient: Virtual Visit Location Patient: Home Provider: Virtual Visit Location Provider: Home Office   I discussed the limitations of evaluation and management by telemedicine  and the availability of in person appointments. The patient expressed understanding and agreed to proceed.    History of Present Illness: Kelli Snyder is a 60 y.o. who identifies as a female who was assigned female at birth, and is being seen today for   HPI: Sinusitis This is a new problem. The current episode started 1 to 4 weeks ago. The problem has been gradually worsening since onset. There has been no fever. Her pain is at a severity of 6/10. The pain is moderate. Associated symptoms include congestion, headaches, a hoarse voice and sinus pressure. Past treatments include saline sprays. The treatment provided no relief.    Works as a Pharmacist, hospital, was sick two weeks back with a virus and it went away now she has signs of infection setting in.  Problems:  Patient Active Problem List   Diagnosis Date Noted   Colon cancer screening    Polyp of descending colon    Prepatellar bursitis of left knee 08/18/2020   Insomnia 12/31/2019   Seasonal allergies 08/01/2019   Prediabetes 07/23/2019   Encounter for annual general medical examination with abnormal findings in adult 07/23/2019   Hyperlipidemia 07/23/2019   Rash and nonspecific skin eruption 09/18/2018   Asthma 08/28/2018   Chronic recurrent sinusitis 08/28/2018    Allergies:  Allergies  Allergen Reactions   Sulfa Antibiotics Swelling    Swelling and  itching of lips, denies breathing problems Swelling and itching of lips, denies breathing problems    Medications:  Current Outpatient Medications:    Epinastine HCl 0.05 % ophthalmic solution, INSTILL 1 DROP IN BOTH EYES TWICE A DAY AS NEEDED FOR ALLERGIES, Disp: 10 mL, Rfl: 0   EPINEPHrine 0.3 mg/0.3 mL IJ SOAJ injection, Inject 0.3 mg into the muscle as needed for anaphylaxis., Disp: 1 each, Rfl: 0   Multiple Vitamin (MULTIVITAMIN) tablet, Take 1 tablet by mouth daily. , Disp: , Rfl:    PROAIR HFA 108 (90 Base) MCG/ACT inhaler, Inhale 1-2 puffs into the lungs every 6 (six) hours as  needed for wheezing or shortness of breath., Disp: 8.5 g, Rfl: 0   QNASL 80 MCG/ACT AERS, Place 1 spray into the nose daily as needed., Disp: 10.6 g, Rfl: 3   traZODone (DESYREL) 50 MG tablet, TAKE 1 AND 1/2 TABLETS(75 MG) BY MOUTH AT BEDTIME FOR SLEEP, Disp: 135 tablet, Rfl: 1  Observations/Objective: Patient is well-developed, well-nourished in no acute distress.  Resting comfortably  at home.  Head is normocephalic, atraumatic.  No labored breathing.  Speech is clear and coherent with logical content.  Patient is alert and oriented at baseline.  Nasal tone  Assessment and Plan: 1. Acute non-recurrent maxillary sinusitis S&S consistent with post virus sinus infection Augmentin for treatment OTC measures reviewed refill on allergy meds provided as well   - amoxicillin-clavulanate (AUGMENTIN) 875-125 MG tablet; Take 1 tablet by mouth 2 (two) times daily for 7 days.  Dispense: 14 tablet; Refill: 0  2. Seasonal allergies Refills needed - QNASL 80 MCG/ACT AERS; Place 1 spray into the nose daily as needed.  Dispense: 10.6 g; Refill: 0 - Epinastine HCl 0.05 % ophthalmic solution; INSTILL 1 DROP IN BOTH EYES TWICE A DAY AS NEEDED FOR ALLERGIES  Dispense: 10 mL; Refill: 0  3. Mild intermittent asthma without complication Refills needed  - QNASL 80 MCG/ACT AERS; Place 1 spray into the nose daily as needed.  Dispense: 10.6 g; Refill: 0 - Epinastine HCl 0.05 % ophthalmic solution; INSTILL 1 DROP IN BOTH EYES TWICE A DAY AS NEEDED FOR ALLERGIES  Dispense: 10 mL; Refill: 0    Follow Up Instructions: I discussed the assessment and treatment plan with the patient. The patient was provided an opportunity to ask questions and all were answered. The patient agreed with the plan and demonstrated an understanding of the instructions.  A copy of instructions were sent to the patient via MyChart unless otherwise noted below.   The patient was advised to call back or seek an in-person evaluation if  the symptoms worsen or if the condition fails to improve as anticipated.  Time:  I spent 10 minutes with the patient via telehealth technology discussing the above problems/concerns.    Kelli Mayo, NP

## 2021-06-17 ENCOUNTER — Other Ambulatory Visit: Payer: Self-pay | Admitting: Primary Care

## 2021-06-17 ENCOUNTER — Other Ambulatory Visit: Payer: Self-pay | Admitting: Family Medicine

## 2021-06-17 DIAGNOSIS — J302 Other seasonal allergic rhinitis: Secondary | ICD-10-CM

## 2021-06-17 DIAGNOSIS — J4520 Mild intermittent asthma, uncomplicated: Secondary | ICD-10-CM

## 2021-06-24 ENCOUNTER — Telehealth: Payer: Self-pay | Admitting: Primary Care

## 2021-06-24 DIAGNOSIS — J4520 Mild intermittent asthma, uncomplicated: Secondary | ICD-10-CM

## 2021-06-24 NOTE — Telephone Encounter (Signed)
Mrs. Opal Sidles called in and stated that she was trying to get a refill thru [facility] and they told her that they do not make that one anymore and would need a new script

## 2021-06-24 NOTE — Telephone Encounter (Signed)
Left message to return call to our office.  

## 2021-06-25 NOTE — Telephone Encounter (Signed)
Received fax from pharmacy today. Requesting change to Albuterol HFA INH 8.5 mg 90MCG ( generic Proair HFA inhaler)

## 2021-06-27 MED ORDER — ALBUTEROL SULFATE HFA 108 (90 BASE) MCG/ACT IN AERS: 2.0000 | INHALATION_SPRAY | Freq: Four times a day (QID) | RESPIRATORY_TRACT | 0 refills | Status: DC | PRN

## 2021-06-27 NOTE — Telephone Encounter (Signed)
Noted. I sent generic albuterol to [facility].

## 2021-06-27 NOTE — Addendum Note (Signed)
Addended by: [name] on: 06/27/2021 07:10 PM   Modules accepted: Orders

## 2021-07-28 ENCOUNTER — Telehealth: Payer: Self-pay | Admitting: Primary Care

## 2021-07-28 NOTE — Telephone Encounter (Signed)
Pt called stating that she had a refill for medication traZODone (DESYREL) 50 MG tablet at [facility] in [place]. Pt states that they refilled it but refilled it with the wrong medication. Pt states that she will be returning the wrong prescription but would need for a new refill of medication traZODone (DESYREL) 50 MG tablet to be called in.Please advise. ?

## 2021-07-28 NOTE — Telephone Encounter (Signed)
Called patient left message to call office for more information.  ?Called pharmacy that was listed and states that patient filled last with them 8/22. They have no information about error in their chart.  ?

## 2021-07-29 NOTE — Telephone Encounter (Signed)
Left message to return call to our office.  

## 2021-07-29 NOTE — Telephone Encounter (Signed)
Pt returned your call and I spoke to her about the issue. She does not need a new rx. She is going to return the Rx that isn't hers tomorrow and will call us if the pharmacy gives her any issues.  ?

## 2021-08-05 ENCOUNTER — Other Ambulatory Visit: Payer: Self-pay | Admitting: Primary Care

## 2021-08-05 DIAGNOSIS — J302 Other seasonal allergic rhinitis: Secondary | ICD-10-CM

## 2021-08-05 DIAGNOSIS — J4520 Mild intermittent asthma, uncomplicated: Secondary | ICD-10-CM

## 2021-09-30 ENCOUNTER — Ambulatory Visit (INDEPENDENT_AMBULATORY_CARE_PROVIDER_SITE_OTHER): Admitting: Primary Care

## 2021-09-30 ENCOUNTER — Other Ambulatory Visit (HOSPITAL_COMMUNITY)
Admission: RE | Admit: 2021-09-30 | Discharge: 2021-09-30 | Disposition: A | Discharge status: Home / Self Care | Source: Ambulatory Visit | Attending: Primary Care | Admitting: Primary Care

## 2021-09-30 ENCOUNTER — Encounter: Payer: Self-pay | Admitting: Primary Care

## 2021-09-30 VITALS — BP 114/72 | HR 76 | Temp 97.7°F | Ht 64.0 in | Wt 123.0 lb

## 2021-09-30 DIAGNOSIS — Z0000 Encounter for general adult medical examination without abnormal findings: Secondary | ICD-10-CM | POA: Diagnosis not present

## 2021-09-30 DIAGNOSIS — G4700 Insomnia, unspecified: Secondary | ICD-10-CM

## 2021-09-30 DIAGNOSIS — J329 Chronic sinusitis, unspecified: Secondary | ICD-10-CM | POA: Diagnosis not present

## 2021-09-30 DIAGNOSIS — E785 Hyperlipidemia, unspecified: Secondary | ICD-10-CM | POA: Diagnosis not present

## 2021-09-30 DIAGNOSIS — Z124 Encounter for screening for malignant neoplasm of cervix: Secondary | ICD-10-CM

## 2021-09-30 DIAGNOSIS — R7303 Prediabetes: Secondary | ICD-10-CM | POA: Diagnosis not present

## 2021-09-30 DIAGNOSIS — Z1231 Encounter for screening mammogram for malignant neoplasm of breast: Secondary | ICD-10-CM

## 2021-09-30 DIAGNOSIS — J4520 Mild intermittent asthma, uncomplicated: Secondary | ICD-10-CM

## 2021-09-30 DIAGNOSIS — Z0001 Encounter for general adult medical examination with abnormal findings: Secondary | ICD-10-CM

## 2021-09-30 DIAGNOSIS — K635 Polyp of colon: Secondary | ICD-10-CM

## 2021-09-30 DIAGNOSIS — J302 Other seasonal allergic rhinitis: Secondary | ICD-10-CM

## 2021-09-30 MED ORDER — QNASL 80 MCG/ACT NA AERS: 1.0000 | INHALATION_SPRAY | Freq: Every day | NASAL | 3 refills | Status: DC | PRN

## 2021-09-30 MED ORDER — AMOXICILLIN-POT CLAVULANATE 875-125 MG PO TABS: 1.0000 | ORAL_TABLET | Freq: Two times a day (BID) | ORAL | 0 refills | Status: DC

## 2021-09-30 NOTE — Assessment & Plan Note (Signed)
Commended her on a healthy lifestyle.   Repeat lipid panel pending.

## 2021-09-30 NOTE — Assessment & Plan Note (Signed)
Controlled.  Continue albuterol inhaler.  Increased use with seasonal changes.

## 2021-09-30 NOTE — Patient Instructions (Signed)
Stop by the lab prior to leaving today. I will notify you of your results once received.   [facility].   Start Augmentin antibiotics for the infection Take 1 tablet by mouth twice daily for 7 days.  Set up a nurse visit to return for your Shingles vaccine.  It was a pleasure to see you today!  Preventive Care 59-59 Years Old, Female Preventive care refers to lifestyle choices and visits with your health care provider that can promote health and wellness. Preventive care visits are also called wellness exams. What can I expect for my preventive care visit? Counseling Your health care provider may ask you questions about your: Medical history, including: Past medical problems. Family medical history. Pregnancy history. Current health, including: Menstrual cycle. Method of birth control. Emotional well-being. Home life and relationship well-being. Sexual activity and sexual health. Lifestyle, including: Alcohol, nicotine or tobacco, and drug use. Access to firearms. Diet, exercise, and sleep habits. Work and work Statistician. Sunscreen use. Safety issues such as seatbelt and bike helmet use. Physical exam Your health care provider will check your: Height and weight. These may be used to calculate your BMI (body mass index). BMI is a measurement that tells if you are at a healthy weight. Waist circumference. This measures the distance around your waistline. This measurement also tells if you are at a healthy weight and may help predict your risk of certain diseases, such as type 2 diabetes and high blood pressure. Heart rate and blood pressure. Body temperature. Skin for abnormal spots. What immunizations do I need?  Vaccines are usually given at various ages, according to a schedule. Your health care provider will recommend vaccines for you based on your age, medical history, and lifestyle or other factors, such as travel or where you  work. What tests do I need? Screening Your health care provider may recommend screening tests for certain conditions. This may include: Lipid and cholesterol levels. Diabetes screening. This is done by checking your blood sugar (glucose) after you have not eaten for a while (fasting). Pelvic exam and Pap test. Hepatitis B test. Hepatitis C test. HIV (human immunodeficiency virus) test. STI (sexually transmitted infection) testing, if you are at risk. Lung cancer screening. Colorectal cancer screening. Mammogram. Talk with your health care provider about when you should start having regular mammograms. This may depend on whether you have a family history of breast cancer. BRCA-related cancer screening. This may be done if you have a family history of breast, ovarian, tubal, or peritoneal cancers. Bone density scan. This is done to screen for osteoporosis. Talk with your health care provider about your test results, treatment options, and if necessary, the need for more tests. Follow these instructions at home: Eating and drinking  Eat a diet that includes fresh fruits and vegetables, whole grains, lean protein, and low-fat dairy products. Take vitamin and mineral supplements as recommended by your health care provider. Do not drink alcohol if: Your health care provider tells you not to drink. You are pregnant, may be pregnant, or are planning to become pregnant. If you drink alcohol: Limit how much you have to 0-1 drink a day. Know how much alcohol is in your drink. In the [place] one drink equals one 12 oz bottle of beer (355 mL), one 5 oz glass of wine (148 mL), or one 1 oz glass of hard liquor (44 mL). Lifestyle Brush your teeth every morning and night with fluoride toothpaste. Floss one time  each day. Exercise for at least 30 minutes 5 or more days each week. Do not use any products that contain nicotine or tobacco. These products include cigarettes, chewing tobacco, and vaping  devices, such as e-cigarettes. If you need help quitting, ask your health care provider. Do not use drugs. If you are sexually active, practice safe sex. Use a condom or other form of protection to prevent STIs. If you do not wish to become pregnant, use a form of birth control. If you plan to become pregnant, see your health care provider for a prepregnancy visit. Take aspirin only as told by your health care provider. Make sure that you understand how much to take and what form to take. Work with your health care provider to find out whether it is safe and beneficial for you to take aspirin daily. Find healthy ways to manage stress, such as: Meditation, yoga, or listening to music. Journaling. Talking to a trusted person. Spending time with friends and family. Minimize exposure to UV radiation to reduce your risk of skin cancer. Safety Always wear your seat belt while driving or riding in a vehicle. Do not drive: If you have been drinking alcohol. Do not ride with someone who has been drinking. When you are tired or distracted. While texting. If you have been using any mind-altering substances or drugs. Wear a helmet and other protective equipment during sports activities. If you have firearms in your house, make sure you follow all gun safety procedures. Seek help if you have been physically or sexually abused. What's next? Visit your health care provider once a year for an annual wellness visit. Ask your health care provider how often you should have your eyes and teeth checked. Stay up to date on all vaccines. This information is not intended to replace advice given to you by your health care provider. Make sure you discuss any questions you have with your health care provider. Document Revised: 10/14/2020 Document Reviewed: 10/14/2020 Elsevier Patient Education  [place].

## 2021-09-30 NOTE — Assessment & Plan Note (Signed)
Improving.   Continue Trazodone 50-75 mg nightly PRN.

## 2021-09-30 NOTE — Assessment & Plan Note (Signed)
Commended her on dietary changes and regular exercise.  Repeat A1C pending.

## 2021-09-30 NOTE — Assessment & Plan Note (Addendum)
Acute on chronic flare.  Start Augmentin 875 BID x 7 days sent to pharmacy.  Continue Qnasal daily.

## 2021-09-30 NOTE — Progress Notes (Signed)
Subjective:    Patient ID: Kelli Snyder, female    DOB: 04/12/1961, 61 y.o.   MRN: [id number]  HPI  Kelli Snyder is a very pleasant 61 y.o. female who presents today for complete physical and follow up of chronic conditions.  She would also like to mention acute nasal congestion and sinus pressure. Acute for the last 2 weeks. History of sinusitis, this feels similar. She's recently traveled up to [place], climate was different. Also around some younger kids with symptoms of congestion. She's been using her Qnasal without much improvement.   Immunizations: -Tetanus: Unsure, she will check on her records.  -Influenza: Did not complete last season  -Covid-19: 3 vaccines  -Shingles: Completed one dose of Shingrix in April 2022  Diet: Healthy diet.  Exercise: Regular exercise.  Eye exam: Completed several years ago Dental exam: Completes semi-annually   Pap Smear: Completed in June 2019, due Mammogram: Completed in July 2022  Colonoscopy: Completed in 2022, due 2032   BP Readings from Last 3 Encounters:  09/30/21 114/72  12/29/20 114/71  08/18/20 132/66        Review of Systems  Constitutional:  Negative for unexpected weight change.  HENT:  Positive for congestion and sinus pressure. Negative for rhinorrhea.   Respiratory:  Negative for cough and shortness of breath.   Cardiovascular:  Negative for chest pain.  Gastrointestinal:  Negative for constipation and diarrhea.  Genitourinary:  Negative for difficulty urinating.  Musculoskeletal:  Negative for arthralgias and myalgias.  Skin:  Negative for rash.  Allergic/Immunologic: Positive for environmental allergies.  Neurological:  Negative for dizziness and headaches.  Psychiatric/Behavioral:  The patient is not nervous/anxious.         Past Medical History:  Diagnosis Date   Chronic seasonal allergic rhinitis    Chronic sinusitis    Dysplastic nevus 11/12/2014   R mid helix - moderate   Dysplastic nevus  10/24/2019   R low back sacral paraspinal    Exercise-induced asthma    Migraines     Social History   Socioeconomic History   Marital status: Married    Spouse name: Not on file   Number of children: Not on file   Years of education: Not on file   Highest education level: Not on file  Occupational History   Not on file  Tobacco Use   Smoking status: Never   Smokeless tobacco: Never  Vaping Use   Vaping Use: Never used  Substance and Sexual Activity   Alcohol use: Yes   Drug use: Never   Sexual activity: Yes  Other Topics Concern   Not on file  Social History 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   Married.   2 children.   Social Determinants of Health   Financial Resource Strain: Not on file  Food Insecurity: Not on file  Transportation Needs: Not on file  Physical Activity: Not on file  Stress: Not on file  Social Connections: Not on file  Intimate Partner Violence: Not on file    Past Surgical History:  Procedure Laterality Date   COLONOSCOPY WITH PROPOFOL N/A 12/29/2020   Procedure: COLONOSCOPY WITH PROPOFOL;  Surgeon: Lucilla Lame, MD;  Location: [facility];  Service: Endoscopy;  Laterality: N/A;   GUM SURGERY  1991   TUBAL LIGATION  1997    Family History  Problem Relation Age of Onset   Breast cancer Mother 40       On HRT  prior to diagnosis    Hyperlipidemia Mother    Melanoma Mother    Hypertension Paternal Grandmother    Cancer Paternal Grandfather     Allergies  Allergen Reactions   Sulfa Antibiotics Swelling    Swelling and itching of lips, denies breathing problems Swelling and itching of lips, denies breathing problems     Current Outpatient Medications on File Prior to Visit  Medication Sig Dispense Refill   albuterol (VENTOLIN HFA) 108 (90 Base) MCG/ACT inhaler Inhale 2 puffs into the lungs every 6 (six) hours as needed for wheezing or shortness of breath. 8 g 0   Epinastine HCl 0.05 % ophthalmic  solution INSTILL 1 DROP IN BOTH EYES TWICE A DAY AS NEEDED FOR ALLERGIES 10 mL 0   EPINEPHrine 0.3 mg/0.3 mL IJ SOAJ injection Inject 0.3 mg into the muscle as needed for anaphylaxis. 1 each 0   Multiple Vitamin (MULTIVITAMIN) tablet Take 1 tablet by mouth daily.      QNASL 80 MCG/ACT AERS Place 1 spray into the nose daily as needed. Office visit required for further refills. 10.6 g 0   traZODone (DESYREL) 50 MG tablet TAKE 1 AND 1/2 TABLETS(75 MG) BY MOUTH AT BEDTIME FOR SLEEP 135 tablet 1   No current facility-administered medications on file prior to visit.    BP 114/72   Pulse 76   Temp 97.7 F (36.5 C) (Oral)   Ht '5\' 4"'$  (1.626 m)   Wt 123 lb (55.8 kg)   SpO2 99%   BMI 21.11 kg/m  Objective:   Physical Exam Exam conducted with a chaperone present.  HENT:     Right Ear: Tympanic membrane and ear canal normal.     Left Ear: Tympanic membrane and ear canal normal.     Nose: Nose normal.  Eyes:     Conjunctiva/sclera: Conjunctivae normal.     Pupils: Pupils are equal, round, and reactive to light.  Neck:     Thyroid: No thyromegaly.  Cardiovascular:     Rate and Rhythm: Normal rate and regular rhythm.     Heart sounds: No murmur heard. Pulmonary:     Effort: Pulmonary effort is normal.     Breath sounds: Normal breath sounds. No rales.  Abdominal:     General: Bowel sounds are normal.     Palpations: Abdomen is soft.     Tenderness: There is no abdominal tenderness.  Genitourinary:    Labia:        Right: No tenderness or lesion.        Left: No tenderness or lesion.      Vagina: Normal.     Cervix: Normal.     Uterus: Normal.      Adnexa: Right adnexa normal and left adnexa normal.  Musculoskeletal:        General: Normal range of motion.     Cervical back: Neck supple.  Lymphadenopathy:     Cervical: No cervical adenopathy.  Skin:    General: Skin is warm and dry.     Findings: No rash.  Neurological:     Mental Status: She is alert and oriented to person,  place, and time.     Cranial Nerves: No cranial nerve deficit.     Deep Tendon Reflexes: Reflexes are normal and symmetric.  Psychiatric:        Mood and Affect: Mood normal.          Assessment & Plan:

## 2021-09-30 NOTE — Assessment & Plan Note (Signed)
Colonoscopy UTD, due 2032.

## 2021-09-30 NOTE — Assessment & Plan Note (Signed)
Tetanus UTD per patient, she will find records. Second Shingrix due, she will set up a nurse visit.   Colonoscopy UTD, due 2032. Pap smear due, completed today. Mammogram due in July 2023.  Commended her on regular exercise and a healthy diet.  Exam today stable Labs pending

## 2021-10-01 LAB — CBC
HCT: 39.4 % (ref 36.0–46.0)
Hemoglobin: 13 g/dL (ref 12.0–15.0)
MCHC: 32.9 g/dL (ref 30.0–36.0)
MCV: 89.7 fl (ref 78.0–100.0)
Platelets: 259 10*3/uL (ref 150.0–400.0)
RBC: 4.4 Mil/uL (ref 3.87–5.11)
RDW: 13.8 % (ref 11.5–15.5)
WBC: 6.1 10*3/uL (ref 4.0–10.5)

## 2021-10-01 LAB — LIPID PANEL
Cholesterol: 266 mg/dL — ABNORMAL HIGH (ref 0–200)
HDL: 117.4 mg/dL (ref >39.00)
LDL Cholesterol: 129 mg/dL — ABNORMAL HIGH (ref 0–99)
NonHDL: 148.78
Total CHOL/HDL Ratio: 2
Triglycerides: 97 mg/dL (ref 0.0–149.0)
VLDL: 19.4 mg/dL (ref 0.0–40.0)

## 2021-10-01 LAB — COMPREHENSIVE METABOLIC PANEL
ALT: 21 U/L (ref 0–35)
AST: 26 U/L (ref 0–37)
Albumin: 4.1 g/dL (ref 3.5–5.2)
Alkaline Phosphatase: 66 U/L (ref 39–117)
BUN: 18 mg/dL (ref 6–23)
CO2: 29 mEq/L (ref 19–32)
Calcium: 9.1 mg/dL (ref 8.4–10.5)
Chloride: 103 mEq/L (ref 96–112)
Creatinine, Ser: 0.95 mg/dL (ref 0.40–1.20)
GFR: 66.04 mL/min (ref >60.00)
Glucose, Bld: 115 mg/dL — ABNORMAL HIGH (ref 70–99)
Potassium: 4.2 mEq/L (ref 3.5–5.1)
Sodium: 137 mEq/L (ref 135–145)
Total Bilirubin: 0.4 mg/dL (ref 0.2–1.2)
Total Protein: 6.6 g/dL (ref 6.0–8.3)

## 2021-10-01 LAB — HEMOGLOBIN A1C: Hgb A1c MFr Bld: 5.8 % (ref 4.6–6.5)

## 2021-10-04 LAB — CYTOLOGY - PAP
Comment: NEGATIVE
Diagnosis: NEGATIVE
High risk HPV: NEGATIVE

## 2021-10-30 ENCOUNTER — Other Ambulatory Visit: Payer: Self-pay | Admitting: Primary Care

## 2021-10-30 DIAGNOSIS — G4700 Insomnia, unspecified: Secondary | ICD-10-CM

## 2021-11-03 ENCOUNTER — Ambulatory Visit (INDEPENDENT_AMBULATORY_CARE_PROVIDER_SITE_OTHER)

## 2021-11-03 DIAGNOSIS — Z23 Encounter for immunization: Secondary | ICD-10-CM

## 2021-11-03 NOTE — Progress Notes (Signed)
Per orders of [name], NP 2nd injection of shingrix, given by Loreen Freud. Patient tolerated injection well.

## 2021-11-08 ENCOUNTER — Telehealth: Payer: Self-pay

## 2021-11-08 NOTE — Telephone Encounter (Signed)
[place] [facility] - Client TELEPHONE ADVICE RECORD [facility] Patient Name: Kelli Snyder Gender: Female DOB: 1960/10/17 Age: 61 Y 64 M 17 D Return Phone Number: [phone] (Primary) Address: City/ State/ Zip:  Client [place] [facility] - Client Client Site [place] - Day Provider Alma Friendly - NP Contact Type Call Who Is Calling Patient / Member / Family / Caregiver Call Type Triage / Clinical Relationship To Patient Self Return Phone Number [phone] (Primary) Chief Complaint Allergic reaction (unknown symptoms) Reason for Call Symptomatic / Request for [name] states she got stung by a bee on her back, her eye is now swollen shut. She has an epi pen Translation No Disp. Time Eilene Ghazi Time) Disposition Final User 11/08/2021 8:21:23 AM Attempt made - message left Deaton, RN, Levada Dy 11/08/2021 8:25:19 AM Attempt made - message left Deaton, RN, Levada Dy 11/08/2021 8:38:37 AM FINAL ATTEMPT MADE - message left Yes Deaton, RN, Levada Dy Final Disposition 11/08/2021 8:38:37 AM FINAL ATTEMPT MADE - message left Yes Deaton, RN, Levada Dy Comments User: Saverio Danker, RN Date/Time Eilene Ghazi Time): 11/08/2021 8:23:20 AM Charge nurse notified of the attempt

## 2021-11-08 NOTE — Telephone Encounter (Signed)
Patient returned call and asked for a call back from [place] at [phone]

## 2021-11-08 NOTE — Telephone Encounter (Signed)
Unable to reach pt or pts husband by phone; left v/m on pts cell requesting phone call for update on pts condition. Sending note to [place] triage and Gentry Fitz NP and Joellen  CMA.

## 2021-11-08 NOTE — Telephone Encounter (Signed)
Pt said that the bee sting on lower back.was on 11/07/21. Lt eye closed completely shut on 11/07/21.  pt took advil and claritin on 11/07/21; pt could tell much improvement in lt eye this morning. No blurred or double vision. No swelling in throat and no difficulty breathing. Pt did not use epipen. Pt has not had H/A, dizziness, CP or SOB. Pt wants to know if it is unusual to have swelling in lt eye when bee sting was on lower back and what antihistamine does Anda Kraft recommend for future reference. Sending note to Gentry Fitz NP and [facility] CMA. UC & ED precautions given and pt voiced understanding.

## 2021-11-09 NOTE — Telephone Encounter (Signed)
Sorry to hear this! Recommend Benadryl if she gets another bee sting, take immediately after bee sting without waiting. This may cause drowsiness. If any throat tightness/SOB/wheezing then use Epi Pen.

## 2021-11-09 NOTE — Telephone Encounter (Signed)
Patient notified as instructed by telephone and verbalized understanding. 

## 2021-12-01 ENCOUNTER — Encounter

## 2021-12-09 ENCOUNTER — Ambulatory Visit
Admission: RE | Admit: 2021-12-09 | Discharge: 2021-12-09 | Disposition: A | Discharge status: Home / Self Care | Source: Ambulatory Visit | Attending: Primary Care | Admitting: Primary Care

## 2021-12-09 DIAGNOSIS — Z1231 Encounter for screening mammogram for malignant neoplasm of breast: Secondary | ICD-10-CM | POA: Insufficient documentation

## 2021-12-13 ENCOUNTER — Telehealth: Payer: Self-pay

## 2021-12-13 DIAGNOSIS — G4700 Insomnia, unspecified: Secondary | ICD-10-CM

## 2021-12-13 NOTE — Telephone Encounter (Signed)
[facility] looks like last refill was called in to local pharmacy

## 2021-12-23 ENCOUNTER — Ambulatory Visit (INDEPENDENT_AMBULATORY_CARE_PROVIDER_SITE_OTHER): Admitting: Dermatology

## 2021-12-23 DIAGNOSIS — D1800 Hemangioma unspecified site: Secondary | ICD-10-CM

## 2021-12-23 DIAGNOSIS — D2271 Melanocytic nevi of right lower limb, including hip: Secondary | ICD-10-CM

## 2021-12-23 DIAGNOSIS — D225 Melanocytic nevi of trunk: Secondary | ICD-10-CM | POA: Diagnosis not present

## 2021-12-23 DIAGNOSIS — I8393 Asymptomatic varicose veins of bilateral lower extremities: Secondary | ICD-10-CM | POA: Diagnosis not present

## 2021-12-23 DIAGNOSIS — Z1283 Encounter for screening for malignant neoplasm of skin: Secondary | ICD-10-CM | POA: Diagnosis not present

## 2021-12-23 DIAGNOSIS — L578 Other skin changes due to chronic exposure to nonionizing radiation: Secondary | ICD-10-CM

## 2021-12-23 DIAGNOSIS — L821 Other seborrheic keratosis: Secondary | ICD-10-CM

## 2021-12-23 DIAGNOSIS — L814 Other melanin hyperpigmentation: Secondary | ICD-10-CM

## 2021-12-23 DIAGNOSIS — D229 Melanocytic nevi, unspecified: Secondary | ICD-10-CM

## 2021-12-23 NOTE — Patient Instructions (Signed)
Due to recent changes in healthcare laws, you may see results of your pathology and/or laboratory studies on MyChart before the doctors have had a chance to review them. We understand that in some cases there may be results that are confusing or concerning to you. Please understand that not all results are received at the same time and often the doctors may need to interpret multiple results in order to provide you with the best plan of care or course of treatment. Therefore, we ask that you please give us 2 business days to thoroughly review all your results before contacting the office for clarification. Should we see a critical lab result, you will be contacted sooner.   If You Need Anything After Your Visit  If you have any questions or concerns for your doctor, please call our main line at [phone] and press option 4 to reach your doctor's medical assistant. If no one answers, please leave a voicemail as directed and we will return your call as soon as possible. Messages left after 4 pm will be answered the following business day.   You may also send us a message via MyChart. We typically respond to MyChart messages within 1-2 business days.  For prescription refills, please ask your pharmacy to contact our office. Our fax number is [phone].  If you have an urgent issue when the clinic is closed that cannot wait until the next business day, you can page your doctor at the number below.    Please note that while we do our best to be available for urgent issues outside of office hours, we are not available 24/7.   If you have an urgent issue and are unable to reach us, you may choose to seek medical care at your doctor's office, retail clinic, urgent care center, or emergency room.  If you have a medical emergency, please immediately call 911 or go to the emergency department.  Pager Numbers  - Dr. Kowalski: [phone]  - Dr. Moye: [phone]  - Dr. Stewart:  [phone]  In the event of inclement weather, please call our main line at [phone] for an update on the status of any delays or closures.  Dermatology Medication Tips: Please keep the boxes that topical medications come in in order to help keep track of the instructions about where and how to use these. Pharmacies typically print the medication instructions only on the boxes and not directly on the medication tubes.   If your medication is too expensive, please contact our office at [phone] option 4 or send us a message through MyChart.   We are unable to tell what your co-pay for medications will be in advance as this is different depending on your insurance coverage. However, we may be able to find a substitute medication at lower cost or fill out paperwork to get insurance to cover a needed medication.   If a prior authorization is required to get your medication covered by your insurance company, please allow us 1-2 business days to complete this process.  Drug prices often vary depending on where the prescription is filled and some pharmacies may offer cheaper prices.  The website www.goodrx.com contains coupons for medications through different pharmacies. The prices here do not account for what the cost may be with help from insurance (it may be cheaper with your insurance), but the website can give you the price if you did not use any insurance.  - You can print the associated coupon and take it with   your prescription to the pharmacy.  - You may also stop by our office during regular business hours and pick up a GoodRx coupon card.  - If you need your prescription sent electronically to a different pharmacy, notify our office through [place] MyChart or by phone at [phone] option 4.     Si Usted Necesita Algo Despus de Su Visita  Tambin puede enviarnos un mensaje a travs de MyChart. Por lo general respondemos a los mensajes de MyChart en el transcurso de 1 a 2  das hbiles.  Para renovar recetas, por favor pida a su farmacia que se ponga en contacto con nuestra oficina. Nuestro nmero de fax es el [phone].  Si tiene un asunto urgente cuando la clnica est cerrada y que no puede esperar hasta el siguiente da hbil, puede llamar/localizar a su doctor(a) al nmero que aparece a continuacin.   Por favor, tenga en cuenta que aunque hacemos todo lo posible para estar disponibles para asuntos urgentes fuera del horario de oficina, no estamos disponibles las 24 horas del da, los 7 das de la semana.   Si tiene un problema urgente y no puede comunicarse con nosotros, puede optar por buscar atencin mdica  en el consultorio de su doctor(a), en una clnica privada, en un centro de atencin urgente o en una sala de emergencias.  Si tiene una emergencia mdica, por favor llame inmediatamente al 911 o vaya a la sala de emergencias.  Nmeros de bper  - Dr. Kowalski: [phone]  - Dra. Moye: [phone]  - Dra. Stewart: [phone]  En caso de inclemencias del tiempo, por favor llame a nuestra lnea principal al [phone] para una actualizacin sobre el estado de cualquier retraso o cierre.  Consejos para la medicacin en dermatologa: Por favor, guarde las cajas en las que vienen los medicamentos de uso tpico para ayudarle a seguir las instrucciones sobre dnde y cmo usarlos. Las farmacias generalmente imprimen las instrucciones del medicamento slo en las cajas y no directamente en los tubos del medicamento.   Si su medicamento es muy caro, por favor, pngase en contacto con nuestra oficina llamando al [phone] y presione la opcin 4 o envenos un mensaje a travs de MyChart.   No podemos decirle cul ser su copago por los medicamentos por adelantado ya que esto es diferente dependiendo de la cobertura de su seguro. Sin embargo, es posible que podamos encontrar un medicamento sustituto a menor costo o llenar un formulario para que el  seguro cubra el medicamento que se considera necesario.   Si se requiere una autorizacin previa para que su compaa de seguros cubra su medicamento, por favor permtanos de 1 a 2 das hbiles para completar este proceso.  Los precios de los medicamentos varan con frecuencia dependiendo del lugar de dnde se surte la receta y alguna farmacias pueden ofrecer precios ms baratos.  El sitio web www.goodrx.com tiene cupones para medicamentos de diferentes farmacias. Los precios aqu no tienen en cuenta lo que podra costar con la ayuda del seguro (puede ser ms barato con su seguro), pero el sitio web puede darle el precio si no utiliz ningn seguro.  - Puede imprimir el cupn correspondiente y llevarlo con su receta a la farmacia.  - Tambin puede pasar por nuestra oficina durante el horario de atencin regular y recoger una tarjeta de cupones de GoodRx.  - Si necesita que su receta se enve electrnicamente a una farmacia diferente, informe a nuestra oficina a travs de MyChart de [place]   o por telfono llamando al [phone] y presione la opcin 4.  

## 2021-12-23 NOTE — Progress Notes (Signed)
Follow-Up Visit   Subjective  Kelli Snyder is a 61 y.o. female who presents for the following: Annual Exam (Mole check ). Hx of Dysplastic nevus. Patient c/o varicose vein spreading on her legs. The patient presents for Total-Body Skin Exam (TBSE) for skin cancer screening and mole check.  The patient has spots, moles and lesions to be evaluated, some may be new or changing and the patient has concerns that these could be cancer.   The following portions of the chart were reviewed this encounter and updated as appropriate:   Tobacco  Allergies  Meds  Problems  Med Hx  Surg Hx  Fam Hx     Review of Systems:  No other skin or systemic complaints except as noted in HPI or Assessment and Plan.  Objective  Well appearing patient in no apparent distress; mood and affect are within normal limits.  A full examination was performed including scalp, head, eyes, ears, nose, lips, neck, chest, axillae, abdomen, back, buttocks, bilateral upper extremities, bilateral lower extremities, hands, feet, fingers, toes, fingernails, and toenails. All findings within normal limits unless otherwise noted below.  right scapula Brown macule   Right Lower Leg - Anterior - Dilated blue, purple or red veins at the lower extremities   Assessment & Plan  Nevus right scapula Irritated nevus  Discussed shave removal if area becomes bothersome. Benign-appearing.  Observation.  Call clinic for new or changing lesions.  Recommend daily use of broad spectrum spf 30+ sunscreen to sun-exposed areas.   Varicose veins of both lower extremities Right Lower Leg - Anterior Varicose Veins/Spider Veins Chronic and persistent condition with duration or expected duration over one year. Condition is symptomatic / bothersome to patient. Not to goal. - Reassured - Smaller vessels can be treated by sclerotherapy (a procedure to inject a medicine into the veins to make them disappear) if desired, but the treatment is not  covered by insurance. Larger vessels may be covered if symptomatic and we would refer to vascular surgeon if treatment desired.   Will Refer to vascular and vein for evaluation.  Patient would like to discuss her treatment options -especially for the larger vessels.  Lentigines - Scattered tan macules - Due to sun exposure - Benign-appearing, observe - Recommend daily broad spectrum sunscreen SPF 30+ to sun-exposed areas, reapply every 2 hours as needed. - Call for any changes  Seborrheic Keratoses - Stuck-on, waxy, tan-brown papules and/or plaques  - Benign-appearing - Discussed benign etiology and prognosis. - Observe - Call for any changes  Melanocytic Nevi Right mid sole- photo enclosed  See photo No change from previous photo today. - Tan-brown and/or pink-flesh-colored symmetric macules and papules - Benign appearing on exam today - Observation - Call clinic for new or changing moles - Recommend daily use of broad spectrum spf 30+ sunscreen to sun-exposed areas.   Hemangiomas - Red papules - Discussed benign nature - Observe - Call for any changes  Actinic Damage - Chronic condition, secondary to cumulative UV/sun exposure - diffuse scaly erythematous macules with underlying dyspigmentation - Recommend daily broad spectrum sunscreen SPF 30+ to sun-exposed areas, reapply every 2 hours as needed.  - Staying in the shade or wearing long sleeves, sun glasses (UVA+UVB protection) and wide brim hats (4-inch brim around the entire circumference of the hat) are also recommended for sun protection.  - Call for new or changing lesions.  Skin cancer screening performed today.   Return in about 1 year (around 12/24/2022) for TBSE, hx  of Dysplastic nevus .  IMarye Round, CMA, am acting as scribe for Sarina Ser, MD .  Documentation: I have reviewed the above documentation for accuracy and completeness, and I agree with the above.  Sarina Ser, MD

## 2021-12-26 ENCOUNTER — Encounter: Payer: Self-pay | Admitting: Dermatology

## 2021-12-27 ENCOUNTER — Other Ambulatory Visit: Payer: Self-pay

## 2021-12-27 DIAGNOSIS — I8393 Asymptomatic varicose veins of bilateral lower extremities: Secondary | ICD-10-CM

## 2021-12-27 NOTE — Progress Notes (Signed)
Referral sent to [facility] [place] [facility]

## 2022-01-04 ENCOUNTER — Other Ambulatory Visit: Payer: Self-pay | Admitting: Primary Care

## 2022-01-04 DIAGNOSIS — J4520 Mild intermittent asthma, uncomplicated: Secondary | ICD-10-CM

## 2022-01-11 ENCOUNTER — Encounter: Payer: Self-pay | Admitting: Primary Care

## 2022-01-11 MED ORDER — TRAZODONE HCL 50 MG PO TABS: ORAL_TABLET | ORAL | 2 refills | Status: DC

## 2022-01-11 NOTE — Telephone Encounter (Signed)
error 

## 2022-01-11 NOTE — Addendum Note (Signed)
Addended by: [name] on: 01/11/2022 09:34 PM   Modules accepted: Orders

## 2022-01-11 NOTE — Telephone Encounter (Signed)
Patient called in regarding this refill. She stated that [facility] They stated they couldn't refill it and didn't tell her why. She no longer wants to use the [facility] for any of her medication unless its something urgent that's needed. Please advise. Thank you!

## 2022-01-11 NOTE — Telephone Encounter (Signed)
Please have patient call [facility] pharmacy to make sure that none of her medications are on auto-refill. The only way we would send a refill to [facility] pharmacy is if they sent [place] a request. This is likely what occurred. I will send her T[facility]odone to [facility].

## 2022-01-12 NOTE — Telephone Encounter (Signed)
Last message on voicemail to call the office back.

## 2022-01-14 NOTE — Telephone Encounter (Signed)
Left message to return call to our office.  

## 2022-01-17 ENCOUNTER — Telehealth: Payer: Self-pay

## 2022-01-17 NOTE — Telephone Encounter (Signed)
[place] Night - Client Nonclinical Telephone Record  [facility] Client [place] Night - Client Client Site [place] - Night Contact Type Call Who Is Calling Patient / Member / Family / Caregiver Caller Name Watkins Phone Number [phone] Call Type Message Only Information Provided Reason for Call Returning a Call from the Office Initial [place] states she is returning a missed call from the office. Disp. Time Disposition Final User 01/17/2022 8:06:49 AM General Information Provided Yes Laurelyn Sickle Call Closed By: Laurelyn Sickle Transaction Date/Time: 01/17/2022 8:05:23 AM (ET

## 2022-01-17 NOTE — Telephone Encounter (Signed)
Received call from access nurse where patient tried to call office. Have called patient back left message to call office.

## 2022-01-17 NOTE — Telephone Encounter (Signed)
Patient called back. She just wanted to verify that script has been sent to mail order. She had received locally in the past but cost is much lower if sent to mail order. Advised that has been sent to mail order and she should not have any issues getting refill.

## 2022-01-17 NOTE — Telephone Encounter (Signed)
Information added to phone note open about this call.  No further action needed at this time.

## 2022-01-25 ENCOUNTER — Encounter (INDEPENDENT_AMBULATORY_CARE_PROVIDER_SITE_OTHER): Payer: Self-pay | Admitting: Nurse Practitioner

## 2022-01-31 ENCOUNTER — Encounter (INDEPENDENT_AMBULATORY_CARE_PROVIDER_SITE_OTHER): Payer: Self-pay | Admitting: Nurse Practitioner

## 2022-02-22 DIAGNOSIS — G4700 Insomnia, unspecified: Secondary | ICD-10-CM

## 2022-02-23 ENCOUNTER — Ambulatory Visit (INDEPENDENT_AMBULATORY_CARE_PROVIDER_SITE_OTHER): Admitting: Nurse Practitioner

## 2022-02-23 ENCOUNTER — Encounter (INDEPENDENT_AMBULATORY_CARE_PROVIDER_SITE_OTHER): Payer: Self-pay | Admitting: Nurse Practitioner

## 2022-02-23 VITALS — BP 113/73 | HR 83 | Resp 17 | Ht 65.0 in | Wt 119.0 lb

## 2022-02-23 DIAGNOSIS — I83813 Varicose veins of bilateral lower extremities with pain: Secondary | ICD-10-CM | POA: Diagnosis not present

## 2022-02-23 NOTE — Telephone Encounter (Signed)
Unable to reach patient. Left voicemail to return call to our office.   

## 2022-02-23 NOTE — Telephone Encounter (Signed)
Called and spoke with patient. Informed that the trazodone sent to the [facility] is the same medication that she has been taking. Patient stated she's had a stressful week and has taken the trazodone more consistently where as in the past it has just been prn. Asked patient about water intake she stated she tries to be aware of how much she is drinking throughout the day, she is unsure of how much water she drank this past week.  Encouraged to increase water intake for the time being and advised I would sent note to [facility]. Patient stated if it makes any difference she would be willing to pay extra and get prescription through [facility].  Please advise.

## 2022-02-23 NOTE — Progress Notes (Signed)
Subjective:    Patient ID: Kelli Snyder, female    DOB: [birth year], 61 y.o.   MRN: [id number] No chief complaint on file.   Kelli Snyder is a 61 y.o. female who presents for the following:  The patient is seen for evaluation of symptomatic varicose veins.  She has a family history of varicosities.  The varicosities are larger and protrude behind her left knee area.  She notes having some discomfort in the evening especially after she has been standing for a long time.  There is no history of DVT, PE or superficial thrombophlebitis. There is no history of ulceration or hemorrhage. The patient does have a significant family history of varicose veins.  The patient has worn graduated compression in the past. At the present time the patient has been using over-the-counter analgesics. There is no history of prior surgical intervention or sclerotherapy.     Review of Systems  Constitutional: Negative.   Respiratory: Negative.    Cardiovascular: Negative.   Gastrointestinal: Negative.   Genitourinary: Negative.   Skin: Negative.   Neurological: Negative.   Psychiatric/Behavioral: Negative.         Objective:   Physical Exam Constitutional:      Appearance: Normal appearance. She is normal weight.  Cardiovascular:     Rate and Rhythm: Normal rate and regular rhythm.     Pulses:          Dorsalis pedis pulses are 2+ on the right side and 2+ on the left side.       Posterior tibial pulses are 2+ on the right side and 2+ on the left side.  Pulmonary:     Effort: Pulmonary effort is normal.  Abdominal:     General: Abdomen is flat. Bowel sounds are normal.     Palpations: Abdomen is soft.  Musculoskeletal:        General: Normal range of motion.  Skin:    General: Skin is warm and dry.     Capillary Refill: Capillary refill takes less than 2 seconds.       Neurological:     General: No focal deficit present.     Mental Status: She is alert and oriented to person, place,  and time.  Psychiatric:        Mood and Affect: Mood normal.        Behavior: Behavior normal.      There were no vitals taken for this visit.  Past Medical History:  Diagnosis Date   Chronic seasonal allergic rhinitis    Chronic sinusitis    Dysplastic nevus 11/12/2014   R mid helix - moderate   Dysplastic nevus 10/24/2019   R low back sacral paraspinal    Exercise-induced asthma    Migraines     Social History   Socioeconomic History   Marital status: Married    Spouse name: Not on file   Number of children: Not on file   Years of education: Not on file   Highest education level: Not on file  Occupational History   Not on file  Tobacco Use   Smoking status: Never   Smokeless tobacco: Never  Vaping Use   Vaping Use: Never used  Substance and Sexual Activity   Alcohol use: Yes   Drug use: Never   Sexual activity: Yes  Other Topics Concern   Not on file  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   Married.  2 children.   Social Determinants of Health   Financial Resource Strain: Not on file  Food Insecurity: Not on file  Transportation Needs: Not on file  Physical Activity: Not on file  Stress: Not on file  Social Connections: Not on file  Intimate Partner Violence: Not on file    Past Surgical History:  Procedure Laterality Date   COLONOSCOPY WITH PROPOFOL N/A 12/29/2020   Procedure: COLONOSCOPY WITH PROPOFOL;  Surgeon: Lucilla Lame, MD;  Location: [facility] ENDOSCOPY;  Service: Endoscopy;  Laterality: N/A;   GUM SURGERY  1991   TUBAL LIGATION  1997    Family History  Problem Relation Age of Onset   Breast cancer Mother 6       On HRT prior to diagnosis    Hyperlipidemia Mother    Melanoma Mother    Hypertension Paternal Grandmother    Cancer Paternal Grandfather     Allergies  Allergen Reactions   Sulfa Antibiotics Swelling    Swelling and itching of lips, denies breathing problems Swelling and  itching of lips, denies breathing problems        Latest Ref Rng & Units 09/30/2021    3:21 PM 08/18/2020    1:06 PM 07/18/2019    9:29 AM  CBC  WBC 4.0 - 10.5 K/uL 6.1  5.1  4.8   Hemoglobin 12.0 - 15.0 g/dL 13.0  13.9  14.2   Hematocrit 36.0 - 46.0 % 39.4  41.7  43.4   Platelets 150.0 - 400.0 K/uL 259.0  263.0  244.0       CMP     Component Value Date/Time   NA 137 09/30/2021 1521   K 4.2 09/30/2021 1521   CL 103 09/30/2021 1521   CO2 29 09/30/2021 1521   GLUCOSE 115 (H) 09/30/2021 1521   BUN 18 09/30/2021 1521   CREATININE 0.95 09/30/2021 1521   CALCIUM 9.1 09/30/2021 1521   PROT 6.6 09/30/2021 1521   ALBUMIN 4.1 09/30/2021 1521   AST 26 09/30/2021 1521   ALT 21 09/30/2021 1521   ALKPHOS 66 09/30/2021 1521   BILITOT 0.4 09/30/2021 1521     No results found.     Assessment & Plan:   1. Varicose veins of both lower extremities with pain  Recommend:  The patient has large symptomatic varicose veins that are uncomfortable and associated with swelling.  I have had a long discussion with the patient regarding  varicose veins and why they cause symptoms.  Patient will begin wearing graduated compression stockings class 1 on a daily basis, beginning first thing in the morning and removing them in the evening. The patient is instructed specifically not to sleep in the stockings.    The patient  will also begin using over-the-counter analgesics such as Motrin 600 mg po TID to help control the symptoms.    In addition, behavioral modification including elevation during the day will be initiated.    Pending the results of these changes the  patient will be reevaluated in three months.   An ultrasound of the venous system will be obtained.   Further plans will be based on the ultrasound results and whether conservative therapies are successful at eliminating the pain and swelling.     Current Outpatient Medications on File Prior to Visit  Medication Sig Dispense Refill    albuterol (VENTOLIN HFA) 108 (90 Base) MCG/ACT inhaler USE 2 INHALATIONS EVERY 6 HOURS AS NEEDED FOR WHEEZING OR SHORTNESS OF BREATH 8.5 g 13   amoxicillin-clavulanate (  AUGMENTIN) 875-125 MG tablet Take 1 tablet by mouth 2 (two) times daily. 14 tablet 0   Epinastine HCl 0.05 % ophthalmic solution INSTILL 1 DROP IN BOTH EYES TWICE A DAY AS NEEDED FOR ALLERGIES 10 mL 0   EPINEPHrine 0.3 mg/0.3 mL IJ SOAJ injection Inject 0.3 mg into the muscle as needed for anaphylaxis. 1 each 0   Multiple Vitamin (MULTIVITAMIN) tablet Take 1 tablet by mouth daily.      QNASL 80 MCG/ACT AERS Place 1 spray into the nose daily as needed. 31.8 g 3   traZODone (DESYREL) 50 MG tablet TAKE 1 AND 1/2 TABLETS(75 MG) BY MOUTH AT BEDTIME FOR SLEEP 135 tablet 2   No current facility-administered medications on file prior to visit.    There are no Patient Instructions on file for this visit. No follow-ups on file.   Kris Hartmann, NP

## 2022-02-23 NOTE — Telephone Encounter (Signed)
Patient called to return call back to office.

## 2022-02-25 MED ORDER — TRAZODONE HCL 50 MG PO TABS: 75.0000 mg | ORAL_TABLET | Freq: Every day | ORAL | 0 refills | Status: DC

## 2022-02-27 NOTE — Progress Notes (Incomplete)
Subjective:    Patient ID: Kelli Snyder, female    DOB: 1960/06/08, 61 y.o.   MRN: [id number] No chief complaint on file.   Kelli Snyder is a 61 y.o. female who presents for the following:  The patient is seen for evaluation of symptomatic varicose veins.  She has a family history of varicosities.  The varicosities are larger and protrude behind her left knee area.    The patient relates burning and stinging which worsened steadily throughout the course of the day, particularly with standing. The patient also notes an aching and throbbing discomfort over the varicosities, particularly with prolonged dependent positions. The symptoms are significantly improved with elevation.  The patient also notes that during hot weather the symptoms are greatly intensified. The patient states the discomfort from the varicose veins interferes with work, daily exercise, shopping and household maintenance. At this point, the symptoms are persistent and mild enough that they're having a negative impact on lifestyle and are interfering with daily activities.  There is no history of DVT, PE or superficial thrombophlebitis. There is no history of ulceration or hemorrhage. The patient does have a significant family history of varicose veins.  The patient has worn graduated compression in the past. At the present time the patient has been using over-the-counter analgesics. There is no history of prior surgical intervention or sclerotherapy.     Review of Systems  Constitutional: Negative.   Respiratory: Negative.    Cardiovascular: Negative.   Gastrointestinal: Negative.   Genitourinary: Negative.   Skin: Negative.   Neurological: Negative.   Psychiatric/Behavioral: Negative.         Objective:   Physical Exam Constitutional:      Appearance: Normal appearance. She is normal weight.  Cardiovascular:     Rate and Rhythm: Normal rate and regular rhythm.     Pulses:          Dorsalis pedis pulses  are 2+ on the right side and 2+ on the left side.       Posterior tibial pulses are 2+ on the right side and 2+ on the left side.  Pulmonary:     Effort: Pulmonary effort is normal.  Abdominal:     General: Abdomen is flat. Bowel sounds are normal.     Palpations: Abdomen is soft.  Musculoskeletal:        General: Normal range of motion.  Skin:    General: Skin is warm and dry.     Capillary Refill: Capillary refill takes less than 2 seconds.       Neurological:     General: No focal deficit present.     Mental Status: She is alert and oriented to person, place, and time.  Psychiatric:        Mood and Affect: Mood normal.        Behavior: Behavior normal.      There were no vitals taken for this visit.  Past Medical History:  Diagnosis Date  . Chronic seasonal allergic rhinitis   . Chronic sinusitis   . Dysplastic nevus 11/12/2014   R mid helix - moderate  . Dysplastic nevus 10/24/2019   R low back sacral paraspinal   . Exercise-induced asthma   . Migraines     Social History   Socioeconomic History  . Marital status: Married    Spouse name: Not on file  . Number of children: Not on file  . Years of education: Not on file  . Highest education level: Not  on file  Occupational History  . Not on file  Tobacco Use  . Smoking status: Never  . Smokeless tobacco: Never  Vaping Use  . Vaping Use: Never used  Substance and Sexual Activity  . Alcohol use: Yes  . Drug use: Never  . Sexual activity: Yes  Other Topics Concern  . 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   2 children.   Social Determinants of Health   Financial Resource Strain: Not on file  Food Insecurity: Not on file  Transportation Needs: Not on file  Physical Activity: Not on file  Stress: Not on file  Social Connections: Not on file  Intimate Partner Violence: Not on file    Past Surgical History:  Procedure Laterality  Date  . COLONOSCOPY WITH PROPOFOL N/A 12/29/2020   Procedure: COLONOSCOPY WITH PROPOFOL;  Surgeon: Lucilla Lame, MD;  Location: [facility] ENDOSCOPY;  Service: Endoscopy;  Laterality: N/A;  . [place]  . TUBAL LIGATION  1997    Family History  Problem Relation Age of Onset  . Breast cancer Mother 45       On HRT prior to diagnosis   . Hyperlipidemia Mother   . Melanoma Mother   . Hypertension Paternal Grandmother   . Cancer Paternal Grandfather     Allergies  Allergen Reactions  . Sulfa Antibiotics Swelling    Swelling and itching of lips, denies breathing problems Swelling and itching of lips, denies breathing problems        Latest Ref Rng & Units 09/30/2021    3:21 PM 08/18/2020    1:06 PM 07/18/2019    9:29 AM  CBC  WBC 4.0 - 10.5 K/uL 6.1  5.1  4.8   Hemoglobin 12.0 - 15.0 g/dL 13.0  13.9  14.2   Hematocrit 36.0 - 46.0 % 39.4  41.7  43.4   Platelets 150.0 - 400.0 K/uL 259.0  263.0  244.0       CMP     Component Value Date/Time   NA 137 09/30/2021 1521   K 4.2 09/30/2021 1521   CL 103 09/30/2021 1521   CO2 29 09/30/2021 1521   GLUCOSE 115 (H) 09/30/2021 1521   BUN 18 09/30/2021 1521   CREATININE 0.95 09/30/2021 1521   CALCIUM 9.1 09/30/2021 1521   PROT 6.6 09/30/2021 1521   ALBUMIN 4.1 09/30/2021 1521   AST 26 09/30/2021 1521   ALT 21 09/30/2021 1521   ALKPHOS 66 09/30/2021 1521   BILITOT 0.4 09/30/2021 1521     No results found.     Assessment & Plan:   1. Varicose veins of both lower extremities with pain  Recommend:  The patient has large symptomatic varicose veins that are uncomfortable and associated with swelling.  I have had a long discussion with the patient regarding  varicose veins and why they cause symptoms.  Patient will begin wearing graduated compression stockings class 1 on a daily basis, beginning first thing in the morning and removing them in the evening. The patient is instructed specifically not to sleep in the stockings.     The patient  will also begin using over-the-counter analgesics such as Motrin 600 mg po TID to help control the symptoms.    In addition, behavioral modification including elevation during the day will be initiated.    Pending the results of these changes the  patient will be reevaluated in three  months.   An ultrasound of the venous system will be obtained.   Further plans will be based on the ultrasound results and whether conservative therapies are successful at eliminating the pain and swelling.     Current Outpatient Medications on File Prior to Visit  Medication Sig Dispense Refill  . albuterol (VENTOLIN HFA) 108 (90 Base) MCG/ACT inhaler USE 2 INHALATIONS EVERY 6 HOURS AS NEEDED FOR WHEEZING OR SHORTNESS OF BREATH 8.5 g 13  . amoxicillin-clavulanate (AUGMENTIN) 875-125 MG tablet Take 1 tablet by mouth 2 (two) times daily. 14 tablet 0  . Epinastine HCl 0.05 % ophthalmic solution INSTILL 1 DROP IN BOTH EYES TWICE A DAY AS NEEDED FOR ALLERGIES 10 mL 0  . EPINEPHrine 0.3 mg/0.3 mL IJ SOAJ injection Inject 0.3 mg into the muscle as needed for anaphylaxis. 1 each 0  . Multiple Vitamin (MULTIVITAMIN) tablet Take 1 tablet by mouth daily.     Norvel Richards 80 MCG/ACT AERS Place 1 spray into the nose daily as needed. 31.8 g 3  . traZODone (DESYREL) 50 MG tablet TAKE 1 AND 1/2 TABLETS(75 MG) BY MOUTH AT BEDTIME FOR SLEEP 135 tablet 2   No current facility-administered medications on file prior to visit.    There are no Patient Instructions on file for this visit. No follow-ups on file.   Kris Hartmann, NP

## 2022-02-28 ENCOUNTER — Encounter (INDEPENDENT_AMBULATORY_CARE_PROVIDER_SITE_OTHER): Payer: Self-pay

## 2022-03-18 ENCOUNTER — Telehealth: Payer: Self-pay | Admitting: Primary Care

## 2022-03-18 NOTE — Telephone Encounter (Signed)
Unable to reach patient. Left voicemail to return call to our office.   

## 2022-03-18 NOTE — Telephone Encounter (Signed)
Patient called in and stated that she is still experiencing the side effects from traZODone (DESYREL) 50 MG tablet. She was wanting a refill to be sent in but a different brand from what she is using now. She stated that it can go to [facility], [address]. Thank you!

## 2022-03-21 NOTE — Telephone Encounter (Signed)
Spoke with patient informed that a refill was sent on 02/25/2022 to [facility]. Patient was not aware and will call the pharmacy to get filled. Will let us know if she has any issues getting prescription.

## 2022-03-21 NOTE — Telephone Encounter (Signed)
Unable to reach patient. Left voicemail to return call to our office.   

## 2022-03-22 NOTE — Telephone Encounter (Signed)
Pt called & stated she was told by pharmacy that they didn't have the prescription. Call back # [phone]

## 2022-03-22 NOTE — Telephone Encounter (Signed)
Called and spoke with pharmacy, verified that they do have the prescription on file for the patient.  Unable to reach patient to notify. Left voicemail stating the prescription is at [facility] and to call them to get it filled.

## 2022-05-11 ENCOUNTER — Telehealth: Payer: Self-pay | Admitting: Primary Care

## 2022-05-11 NOTE — Telephone Encounter (Signed)
Happy to see her, she will need an office visit.

## 2022-05-11 NOTE — Telephone Encounter (Signed)
Patient in stating that she gets sinus infections very often,Kelli Snyder is aware,and she would like to kinow if she could call in some amoxicillin for her without her being seen?

## 2022-05-13 ENCOUNTER — Telehealth (INDEPENDENT_AMBULATORY_CARE_PROVIDER_SITE_OTHER): Admitting: Family Medicine

## 2022-05-13 ENCOUNTER — Encounter: Payer: Self-pay | Admitting: Family Medicine

## 2022-05-13 VITALS — Temp 97.1°F | Ht 64.0 in | Wt 118.0 lb

## 2022-05-13 DIAGNOSIS — J0100 Acute maxillary sinusitis, unspecified: Secondary | ICD-10-CM | POA: Insufficient documentation

## 2022-05-13 MED ORDER — AMOXICILLIN 500 MG PO CAPS: 1000.0000 mg | ORAL_CAPSULE | Freq: Two times a day (BID) | ORAL | 0 refills | Status: DC

## 2022-05-13 NOTE — Progress Notes (Signed)
VIRTUAL VISIT A virtual visit is felt to be most appropriate for this patient at this time.   I connected with the patient on 05/13/22 at 12:00 PM EST by virtual telehealth platform and verified that I am speaking with the correct person using two identifiers.   I discussed the limitations, risks, security and privacy concerns of performing an evaluation and management service by  virtual telehealth platform and the availability of in person appointments. I also discussed with the patient that there may be a patient responsible charge related to this service. The patient expressed understanding and agreed to proceed.  Patient location: Home Provider Location: [place] Participants: Kelli Snyder and Kelli Snyder   Chief Complaint  Patient presents with   Nasal Congestion   Facial Pressure    Moslty on Left side of face (even teeth hurt)   Headache    History of Present Illness:  62 y.o. female patient of Kelli [name], NP presents with  new onset congestion.  Reviewed medical chart in detail  Date of onset: 2 weeks Initial symptoms included  cold symptoms Symptoms progressed to sinus pressure and facial pain. Has progressed to pain in left sinus and left teeth hurt. New onset fever in last few days. No SOB, no wheeze, no cough.    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  No history of chronic lung disease such as asthma or COPD. Non-smoker.   No antibiotics in last month  COVID 19 screen No recent travel or known exposure to [place] The patient denies respiratory symptoms of COVID 19 at this time.  The importance of social distancing was discussed today.   Review of Systems  Constitutional:  Negative for chills and fever.  HENT:  Negative for congestion and ear pain.   Eyes:  Negative for pain and redness.  Respiratory:  Negative for cough and shortness of breath.    Cardiovascular:  Negative for chest pain, palpitations and leg swelling.  Gastrointestinal:  Negative for abdominal pain, blood in stool, constipation, diarrhea, nausea and vomiting.  Genitourinary:  Negative for dysuria.  Musculoskeletal:  Negative for falls and myalgias.  Skin:  Negative for rash.  Neurological:  Negative for dizziness.  Psychiatric/Behavioral:  Negative for depression. The patient is not nervous/anxious.       Past Medical History:  Diagnosis Date   Chronic seasonal allergic rhinitis    Chronic sinusitis    Dysplastic nevus 11/12/2014   R mid helix - moderate   Dysplastic nevus 10/24/2019   R low back sacral paraspinal    Exercise-induced asthma    Migraines     reports that she has never smoked. She has never used smokeless tobacco. She reports current alcohol use. She reports that she does not use drugs.   Current Outpatient Medications:    albuterol (VENTOLIN HFA) 108 (90 Base) MCG/ACT inhaler, USE 2 INHALATIONS EVERY 6 HOURS AS NEEDED FOR WHEEZING OR SHORTNESS OF BREATH, Disp: 8.5 g, Rfl: 13   amoxicillin (AMOXIL) 500 MG capsule, Take 2 capsules (1,000 mg total) by mouth 2 (two) times daily., Disp: 40 capsule, Rfl: 0   Beclomethasone Dipropionate (QNASL) 80 MCG/ACT AERS, Place 1 each into the nose daily., Disp: , Rfl:    Epinastine HCl 0.05 % ophthalmic solution, INSTILL 1 DROP IN BOTH EYES TWICE A DAY AS NEEDED FOR ALLERGIES, Disp: 10 mL, Rfl: 0  EPINEPHrine 0.3 mg/0.3 mL IJ SOAJ injection, Inject 0.3 mg into the muscle as needed for anaphylaxis., Disp: 1 each, Rfl: 0   Multiple Vitamin (MULTIVITAMIN) tablet, Take 1 tablet by mouth daily. , Disp: , Rfl:    traZODone (DESYREL) 50 MG tablet, Take 1.5 tablets (75 mg total) by mouth at bedtime. For sleep, Disp: 135 tablet, Rfl: 0   Observations/Objective: Temperature (!) 97.1 F (36.2 C), temperature source Temporal, height '5\' 4"'$  (1.626 m), weight 118 lb (53.5 kg).  Physical Exam Constitutional:       General: The patient is not in acute distress. Pulmonary:     Effort: Pulmonary effort is normal. No respiratory distress.  Neurological:     Mental Status: The patient is alert and oriented to person, place, and time.  Psychiatric:        Mood and Affect: Mood normal.        Behavior: Behavior normal.    Assessment and Plan Acute non-recurrent maxillary sinusitis Assessment & Plan: Acute Most likely initially viral upper respiratory tract infection now with signs and symptoms of bacterial rhinosinusitis given late onset fever, unilateral face pain. Recommend continued nasal steroid spray, irrigation and will have her complete a course of amoxicillin 500 mg 2 capsules twice daily x 10 days. Return precautions and ER precautions provided.   Other orders -     Amoxicillin; Take 2 capsules (1,000 mg total) by mouth 2 (two) times daily.  Dispense: 40 capsule; Refill: 0      I discussed the assessment and treatment plan with the patient. The patient was provided an opportunity to ask questions and all were answered. The patient agreed with the plan and demonstrated an understanding of the instructions.   The patient was advised to call back or seek an in-person evaluation if the symptoms worsen or if the condition fails to improve as anticipated.     Kelli Lofts, MD

## 2022-05-13 NOTE — Assessment & Plan Note (Signed)
Acute Most likely initially viral upper respiratory tract infection now with signs and symptoms of bacterial rhinosinusitis given late onset fever, unilateral face pain. Recommend continued nasal steroid spray, irrigation and will have her complete a course of amoxicillin 500 mg 2 capsules twice daily x 10 days. Return precautions and ER precautions provided.

## 2022-05-31 ENCOUNTER — Ambulatory Visit (INDEPENDENT_AMBULATORY_CARE_PROVIDER_SITE_OTHER): Admitting: Vascular Surgery

## 2022-05-31 ENCOUNTER — Encounter (INDEPENDENT_AMBULATORY_CARE_PROVIDER_SITE_OTHER)

## 2022-06-27 ENCOUNTER — Other Ambulatory Visit (INDEPENDENT_AMBULATORY_CARE_PROVIDER_SITE_OTHER): Payer: Self-pay | Admitting: Nurse Practitioner

## 2022-06-27 DIAGNOSIS — I8393 Asymptomatic varicose veins of bilateral lower extremities: Secondary | ICD-10-CM

## 2022-06-28 ENCOUNTER — Encounter (INDEPENDENT_AMBULATORY_CARE_PROVIDER_SITE_OTHER): Payer: Self-pay | Admitting: Nurse Practitioner

## 2022-06-28 ENCOUNTER — Ambulatory Visit (INDEPENDENT_AMBULATORY_CARE_PROVIDER_SITE_OTHER)

## 2022-06-28 ENCOUNTER — Ambulatory Visit (INDEPENDENT_AMBULATORY_CARE_PROVIDER_SITE_OTHER): Admitting: Nurse Practitioner

## 2022-06-28 VITALS — BP 119/75 | HR 65 | Resp 16 | Wt 120.0 lb

## 2022-06-28 DIAGNOSIS — I8311 Varicose veins of right lower extremity with inflammation: Secondary | ICD-10-CM | POA: Diagnosis not present

## 2022-06-28 DIAGNOSIS — I8393 Asymptomatic varicose veins of bilateral lower extremities: Secondary | ICD-10-CM | POA: Diagnosis not present

## 2022-06-28 DIAGNOSIS — E785 Hyperlipidemia, unspecified: Secondary | ICD-10-CM | POA: Diagnosis not present

## 2022-06-28 DIAGNOSIS — I8312 Varicose veins of left lower extremity with inflammation: Secondary | ICD-10-CM

## 2022-07-17 ENCOUNTER — Encounter (INDEPENDENT_AMBULATORY_CARE_PROVIDER_SITE_OTHER): Payer: Self-pay | Admitting: Nurse Practitioner

## 2022-07-17 NOTE — Progress Notes (Signed)
Subjective:    Patient ID: Trechelle Snyder, female    DOB: [birth year], 61 y.o.   MRN: [id number] Chief Complaint  Patient presents with   Follow-up    Ultrasound follow up    Kelli Snyder is a 62 y.o. female who presents for the following:   The patient is seen for evaluation of symptomatic varicose veins.  She has a family history of varicosities.  The varicosities are larger and protrude behind her left knee area.  She notes having some discomfort in the evening especially after she has been standing for a long time.   There is no history of DVT, PE or superficial thrombophlebitis. There is no history of ulceration or hemorrhage. The patient does have a significant family history of varicose veins.    There is no history of prior surgical intervention or sclerotherapy. The patient returns for followup evaluation 3 months after the initial visit. The patient continues to have pain in the lower extremities with dependency. The pain is lessened with elevation. Graduated compression stockings, Class I (20-30 mmHg), have been worn but the stockings do not eliminate the leg pain. Over-the-counter analgesics do not improve the symptoms. The degree of discomfort continues to interfere with daily activities. The patient notes the pain in the legs is causing problems with daily exercise, at the workplace and even with household activities and maintenance such as standing in the kitchen preparing meals and doing dishes.   Venous ultrasound shows normal deep venous system, no evidence of acute or chronic DVT.  Superficial reflux is present in the bilateral great saphenous veins.  There is also noted Baker's cyst behind the left knee    Review of Systems  Cardiovascular:  Positive for leg swelling.  All other systems reviewed and are negative.      Objective:   Physical Exam Vitals reviewed.  HENT:     Head: Normocephalic.  Cardiovascular:     Rate and Rhythm: Normal rate.     Pulses:  Normal pulses.  Pulmonary:     Effort: Pulmonary effort is normal.  Skin:    General: Skin is warm and dry.  Neurological:     Mental Status: She is alert and oriented to person, place, and time.  Psychiatric:        Mood and Affect: Mood normal.        Behavior: Behavior normal.        Thought Content: Thought content normal.        Judgment: Judgment normal.     BP 119/75 (BP Location: Left Arm)   Pulse 65   Resp 16   Wt 120 lb (54.4 kg)   BMI 20.60 kg/m   Past Medical History:  Diagnosis Date   Chronic seasonal allergic rhinitis    Chronic sinusitis    Dysplastic nevus 11/12/2014   R mid helix - moderate   Dysplastic nevus 10/24/2019   R low back sacral paraspinal    Exercise-induced asthma    Migraines     Social History   Socioeconomic History   Marital status: Married    Spouse name: Not on file   Number of children: Not on file   Years of education: Not on file   Highest education level: Not on file  Occupational History   Not on file  Tobacco Use   Smoking status: Never   Smokeless tobacco: Never  Vaping Use   Vaping Use: Never used  Substance and Sexual Activity   Alcohol use:  Yes   Drug use: Never   Sexual activity: Yes  Other Topics Concern   Not on file  Social History Narrative   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   Married.   2 children.   Social Determinants of Health   Financial Resource Strain: Not on file  Food Insecurity: Not on file  Transportation Needs: Not on file  Physical Activity: Not on file  Stress: Not on file  Social Connections: Not on file  Intimate Partner Violence: Not on file    Past Surgical History:  Procedure Laterality Date   COLONOSCOPY WITH PROPOFOL N/A 12/29/2020   Procedure: COLONOSCOPY WITH PROPOFOL;  Surgeon: Lucilla Lame, MD;  Location: [facility] ENDOSCOPY;  Service: Endoscopy;  Laterality: N/A;   GUM SURGERY  1991   TUBAL LIGATION  1997    Family History  Problem Relation Age  of Onset   Breast cancer Mother 69       On HRT prior to diagnosis    Hyperlipidemia Mother    Melanoma Mother    Hypertension Paternal Grandmother    Cancer Paternal Grandfather     Allergies  Allergen Reactions   Sulfa Antibiotics Swelling    Swelling and itching of lips, denies breathing problems Swelling and itching of lips, denies breathing problems        Latest Ref Rng & Units 09/30/2021    3:21 PM 08/18/2020    1:06 PM 07/18/2019    9:29 AM  CBC  WBC 4.0 - 10.5 K/uL 6.1  5.1  4.8   Hemoglobin 12.0 - 15.0 g/dL 13.0  13.9  14.2   Hematocrit 36.0 - 46.0 % 39.4  41.7  43.4   Platelets 150.0 - 400.0 K/uL 259.0  263.0  244.0       CMP     Component Value Date/Time   NA 137 09/30/2021 1521   K 4.2 09/30/2021 1521   CL 103 09/30/2021 1521   CO2 29 09/30/2021 1521   GLUCOSE 115 (H) 09/30/2021 1521   BUN 18 09/30/2021 1521   CREATININE 0.95 09/30/2021 1521   CALCIUM 9.1 09/30/2021 1521   PROT 6.6 09/30/2021 1521   ALBUMIN 4.1 09/30/2021 1521   AST 26 09/30/2021 1521   ALT 21 09/30/2021 1521   ALKPHOS 66 09/30/2021 1521   BILITOT 0.4 09/30/2021 1521     No results found.     Assessment & Plan:   1. Varicose veins of both lower extremities with inflammation Recommend  I have reviewed my previous  discussion with the patient regarding  varicose veins and why they cause symptoms. Patient will continue  wearing graduated compression stockings class 1 on a daily basis, beginning first thing in the morning and removing them in the evening.  The patient is CEAP [id number].  The patient has been wearing compression for more than 12 weeks with no or little benefit.  The patient has been exercising daily for more than 12 weeks. The patient has been elevating and taking OTC pain medications for more than 12 weeks.  None of these have have eliminated the pain related to the varicose veins and venous reflux or the discomfort regarding venous congestion.    In addition,  behavioral modification including elevation during the day was again discussed and this will continue.  The patient has utilized over the counter pain medications and has been exercising.  However, at this time conservative therapy has not alleviated the patient's symptoms of leg pain and  swelling  Recommend: laser ablation of the left great saphenous veins to eliminate the symptoms of pain and swelling of the lower extremities caused by the severe superficial venous reflux disease.  - VAS [place] LOWER EXTREMITY VENOUS REFLUX  2. Hyperlipidemia, unspecified hyperlipidemia type Continue statin as ordered and reviewed, no changes at this time   Current Outpatient Medications on File Prior to Visit  Medication Sig Dispense Refill   albuterol (VENTOLIN HFA) 108 (90 Base) MCG/ACT inhaler USE 2 INHALATIONS EVERY 6 HOURS AS NEEDED FOR WHEEZING OR SHORTNESS OF BREATH 8.5 g 13   Beclomethasone Dipropionate (QNASL) 80 MCG/ACT AERS Place 1 each into the nose daily.     Epinastine HCl 0.05 % ophthalmic solution INSTILL 1 DROP IN BOTH EYES TWICE A DAY AS NEEDED FOR ALLERGIES 10 mL 0   EPINEPHrine 0.3 mg/0.3 mL IJ SOAJ injection Inject 0.3 mg into the muscle as needed for anaphylaxis. 1 each 0   Multiple Vitamin (MULTIVITAMIN) tablet Take 1 tablet by mouth daily.      traZODone (DESYREL) 50 MG tablet Take 1.5 tablets (75 mg total) by mouth at bedtime. For sleep 135 tablet 0   amoxicillin (AMOXIL) 500 MG capsule Take 2 capsules (1,000 mg total) by mouth 2 (two) times daily. (Patient not taking: Reported on 06/28/2022) 40 capsule 0   No current facility-administered medications on file prior to visit.    There are no Patient Instructions on file for this visit. No follow-ups on file.   Kris Hartmann, NP

## 2022-07-22 ENCOUNTER — Telehealth: Payer: Self-pay | Admitting: Primary Care

## 2022-07-22 DIAGNOSIS — G4700 Insomnia, unspecified: Secondary | ICD-10-CM

## 2022-07-22 NOTE — Telephone Encounter (Signed)
Patient would like to know if she can  up her dosage to one and a half tablets, or two of traZODone (DESYREL) 50 MG tablet  Reason: Not sleeping well, Mom died a few months ago of cancer and now her husband has been diagnosed with cancer

## 2022-07-22 NOTE — Telephone Encounter (Signed)
Unable to reach patient. Left voicemail to return call to our office.   

## 2022-07-22 NOTE — Telephone Encounter (Signed)
Please tell her that I'm so sorry to hear this! Yes of course.  Just to clarify, she would like to increase from 1.5 (75 mg) tablets to 2 tablets (100 mg)?

## 2022-07-25 NOTE — Telephone Encounter (Signed)
Unable to reach patient. Left voicemail to return call to our office.   

## 2022-07-25 NOTE — Telephone Encounter (Signed)
Patient called in returning a call she received.  

## 2022-07-26 NOTE — Telephone Encounter (Signed)
Yes, okay to take 2 tablets of Trazodone nightly. Is she running low on her current bottle? If so, I can change the Rx for 2 tabs nightly.  Also needs CPE scheduled for June.

## 2022-07-26 NOTE — Telephone Encounter (Signed)
Patient is currently taking 1 1/2 tab of trazodone and she is requesting to increase to two tab temporarily while she has a lot of family issues going on.  Pt would like a voicemail left on cell phone with response from [facility].

## 2022-07-26 NOTE — Telephone Encounter (Signed)
Called and left message, per patient request with the information. Advised for her to call back and let us know if she is running low on medication and we can send in new Rx.  Also advised to call back and schedule CPE in June at her convenience.

## 2022-07-27 MED ORDER — TRAZODONE HCL 50 MG PO TABS: 100.0000 mg | ORAL_TABLET | Freq: Every day | ORAL | 0 refills | Status: DC

## 2022-07-27 NOTE — Telephone Encounter (Signed)
Noted, new Rx sent to pharmacy. 

## 2022-07-27 NOTE — Addendum Note (Signed)
Addended by: [name] on: 07/27/2022 01:26 PM   Modules accepted: Orders

## 2022-07-27 NOTE — Telephone Encounter (Signed)
Patient called in and stated that she will need a new prescription for the Trazodone since the dosage was increased. She stated to send it over to [place], [place]. Thank you!

## 2022-08-24 ENCOUNTER — Encounter: Payer: Self-pay | Admitting: Family Medicine

## 2022-08-24 ENCOUNTER — Ambulatory Visit (INDEPENDENT_AMBULATORY_CARE_PROVIDER_SITE_OTHER): Admitting: Family Medicine

## 2022-08-24 VITALS — BP 112/70 | HR 73 | Temp 98.6°F | Ht 64.0 in | Wt 118.4 lb

## 2022-08-24 DIAGNOSIS — M79642 Pain in left hand: Secondary | ICD-10-CM | POA: Diagnosis not present

## 2022-08-24 NOTE — Progress Notes (Signed)
    Trish Mancinelli T. Caillou Minus, MD, CAQ Sports Medicine [facility] at [facility] [address] [place], [address]  Phone: [phone]  FAX: [phone]  Kelli Snyder - 62 y.o. female  MRN [id number]  Date of Birth: 12/01/1960  Date: 08/24/2022  PCP: Doreene Nest, NP  Referral: Doreene Nest, NP  Chief Complaint  Patient presents with   Fall    08/22/2022   Swollen Hand    Left   Subjective:   Kelli Snyder is a 62 y.o. very pleasant female patient with Body mass index is 20.32 kg/m. who presents with the following:  DOI: 08/22/2022  Dorsum of the hand is really swollen and she fell.  Mostly on her L hand.  She has extensive bruising and swelling in the hand, but she does not have any focal points of tenderness.  She is able to grip and use her hand without any difficulties.  She has moderate decreased grip strength with fourth and fifth digits, but the remainder of her grip is intact.  She is able to manipulate and do fine and gross motor manipulation without much of significant limitation.  Review of Systems is noted in the HPI, as appropriate  Objective:   BP 112/70   Pulse 73   Temp 98.6 F (37 C) (Temporal)   Ht  (1.626 m)   Wt 118 lb 6 oz (53.7 kg)   SpO2 99%   BMI 20.32 kg/m   GEN: No acute distress; alert,appropriate. PULM: Breathing comfortably in no respiratory distress PSYCH: Normally interactive.     All digits move well.  I extensively examined the patient with fairly forceful exam on all bones of the hand and wrist, and there is no focal tenderness.  Laboratory and Imaging Data:  Assessment and Plan:     ICD-10-CM   1. Left hand pain  M79.642      Extensive bruising and dorsal hematoma after traumatic fall on the hand.  I do not see how this could be fracture clinically with very vigorous exam, and she will follow-up as needed.  Recommended ice and NSAIDs as needed.  Dragon [employer] One speech-to-text  software was used for transcription in this dictation.  Possible transcriptional errors can occur using Animal nutritionist.   Signed,  Elpidio Galea. Malakie Balis, MD   Outpatient Encounter Medications as of 08/24/2022  Medication Sig   albuterol (VENTOLIN HFA) 108 (90 Base) MCG/ACT inhaler USE 2 INHALATIONS EVERY 6 HOURS AS NEEDED FOR WHEEZING OR SHORTNESS OF BREATH   Beclomethasone Dipropionate (QNASL) 80 MCG/ACT AERS Place 1 each into the nose daily.   Epinastine HCl 0.05 % ophthalmic solution INSTILL 1 DROP IN BOTH EYES TWICE A DAY AS NEEDED FOR ALLERGIES   EPINEPHrine 0.3 mg/0.3 mL IJ SOAJ injection Inject 0.3 mg into the muscle as needed for anaphylaxis.   Multiple Vitamin (MULTIVITAMIN) tablet Take 1 tablet by mouth daily.    traZODone (DESYREL) 50 MG tablet Take 2 tablets (100 mg total) by mouth at bedtime. For sleep   [DISCONTINUED] amoxicillin (AMOXIL) 500 MG capsule Take 2 capsules (1,000 mg total) by mouth 2 (two) times daily. (Patient not taking: Reported on 06/28/2022)   No facility-administered encounter medications on file as of 08/24/2022.

## 2022-08-30 ENCOUNTER — Other Ambulatory Visit: Payer: Self-pay | Admitting: Primary Care

## 2022-08-30 DIAGNOSIS — J302 Other seasonal allergic rhinitis: Secondary | ICD-10-CM

## 2022-10-20 ENCOUNTER — Encounter: Payer: Self-pay | Admitting: Primary Care

## 2022-10-20 ENCOUNTER — Telehealth (INDEPENDENT_AMBULATORY_CARE_PROVIDER_SITE_OTHER): Admitting: Primary Care

## 2022-10-20 DIAGNOSIS — J0100 Acute maxillary sinusitis, unspecified: Secondary | ICD-10-CM

## 2022-10-20 MED ORDER — AMOXICILLIN-POT CLAVULANATE 875-125 MG PO TABS: 1.0000 | ORAL_TABLET | Freq: Two times a day (BID) | ORAL | 0 refills | Status: DC

## 2022-10-20 NOTE — Assessment & Plan Note (Signed)
Given duration of symptoms, coupled with no significant improvement, will treat for presumed bacterial involvement.  Start Augmentin antibiotics. Take 1 tablet by mouth twice daily for 7 days. Continue saline nasal rinses.  Discussed to stop Sudafed and avoid use of Afrin.  Follow up PRN

## 2022-10-20 NOTE — Patient Instructions (Signed)
Start Augmentin antibiotics. Take 1 tablet by mouth twice daily for 7 days.  Stop taking Sudafed. Do not use Afrin longer than 3 consecutive days.  It was a pleasure to see you today!

## 2022-10-20 NOTE — Progress Notes (Signed)
Patient ID: Kelli Snyder, female    DOB: Mar 17, 1961, 62 y.o.   MRN: [id number]  Virtual visit completed through [employer], a video enabled telemedicine application. Due to national recommendations of social distancing due to COVID-19, a virtual visit is felt to be most appropriate for this patient at this time. Reviewed limitations, risks, security and privacy concerns of performing a virtual visit and the availability of in person appointments. I also reviewed that there may be a patient responsible charge related to this service. The patient agreed to proceed.   Patient location: home Provider location: [place] at [facility], office Persons participating in this virtual visit: patient, provider   If any vitals were documented, they were collected by patient at home unless specified below.    There were no vitals taken for this visit.   CC: Sinus pressure Subjective:   HPI: Kelli Snyder is a 62 y.o. female with a history of sinusitis, seasonal allergies presenting on 10/20/2022 for Nasal Congestion (Started about 3 weeks ago she had congestion and a cough, she then felt better until beginning of this week and now has congestion, headache, fatigue/)  Symptom onset three weeks ago with with nasal congestion, rhinorrhea, scratchy throat, and cough. She felt better for a few days until one week ago. One week ago she began to notice a return in her congestion with left maxillary and frontal sinus pressure with fatigue. She cannot expel much out of her nose with sinus rinses.  She's been using saline rinses, Afrin, Advil, and Sudafed. She used the Afrin for 2 days. She's been taking Sudafed for 4 days. Overall she's not seeing much of an improvement in her symptoms.       Relevant past medical, surgical, family and social history reviewed and updated as indicated. Interim medical history since our last visit reviewed. Allergies and medications reviewed and updated. Outpatient Medications  Prior to Visit  Medication Sig Dispense Refill   albuterol (VENTOLIN HFA) 108 (90 Base) MCG/ACT inhaler USE 2 INHALATIONS EVERY 6 HOURS AS NEEDED FOR WHEEZING OR SHORTNESS OF BREATH 8.5 g 13   Epinastine HCl 0.05 % ophthalmic solution INSTILL 1 DROP IN BOTH EYES TWICE A DAY AS NEEDED FOR ALLERGIES 10 mL 0   EPINEPHrine 0.3 mg/0.3 mL IJ SOAJ injection Inject 0.3 mg into the muscle as needed for anaphylaxis. 1 each 0   Multiple Vitamin (MULTIVITAMIN) tablet Take 1 tablet by mouth daily.      QNASL 80 MCG/ACT AERS USE 1 SPRAY NASALLY DAILY AS NEEDED 31.8 g 0   traZODone (DESYREL) 50 MG tablet Take 2 tablets (100 mg total) by mouth at bedtime. For sleep 180 tablet 0   No facility-administered medications prior to visit.     Per HPI unless specifically indicated in ROS section below Review of Systems  Constitutional:  Positive for fatigue. Negative for fever.  HENT:  Positive for congestion, sinus pressure and sinus pain.   Respiratory:  Negative for cough.   Neurological:  Positive for headaches.   Objective:  There were no vitals taken for this visit.  Wt Readings from Last 3 Encounters:  08/24/22 118 lb 6 oz (53.7 kg)  06/28/22 120 lb (54.4 kg)  05/13/22 118 lb (53.5 kg)       Physical exam: General: Alert and oriented x 3, no distress, does not appear sickly  Pulmonary: Speaks in complete sentences without increased work of breathing, no cough during visit.  Psychiatric: Normal mood, thought content, and behavior.  Results for orders placed or performed in visit on 09/30/21  Hemoglobin A1c  Result Value Ref Range   Hgb A1c MFr Bld 5.8 4.6 - 6.5 %  Lipid panel  Result Value Ref Range   Cholesterol 266 (H) 0 - 200 mg/dL   Triglycerides 16.1 0.0 - 149.0 mg/dL   HDL 096.04 >54.09 mg/dL   VLDL 81.1 0.0 - 91.4 mg/dL   LDL Cholesterol 782 (H) 0 - 99 mg/dL   Total CHOL/HDL Ratio 2    NonHDL 148.78   Comprehensive metabolic panel  Result Value Ref Range   Sodium 137 135 -  145 mEq/L   Potassium 4.2 3.5 - 5.1 mEq/L   Chloride 103 96 - 112 mEq/L   CO2 29 19 - 32 mEq/L   Glucose, Bld 115 (H) 70 - 99 mg/dL   BUN 18 6 - 23 mg/dL   Creatinine, Ser 9.56 0.40 - 1.20 mg/dL   Total Bilirubin 0.4 0.2 - 1.2 mg/dL   Alkaline Phosphatase 66 39 - 117 U/L   AST 26 0 - 37 U/L   ALT 21 0 - 35 U/L   Total Protein 6.6 6.0 - 8.3 g/dL   Albumin 4.1 3.5 - 5.2 g/dL   GFR 21.30 >86.57 mL/min   Calcium 9.1 8.4 - 10.5 mg/dL  CBC  Result Value Ref Range   WBC 6.1 4.0 - 10.5 K/uL   RBC 4.40 3.87 - 5.11 Mil/uL   Platelets 259.0 150.0 - 400.0 K/uL   Hemoglobin 13.0 12.0 - 15.0 g/dL   HCT 84.6 96.2 - 95.2 %   MCV 89.7 78.0 - 100.0 fl   MCHC 32.9 30.0 - 36.0 g/dL   RDW 84.1 32.4 - 40.1 %  Cytology - PAP  Result Value Ref Range   High risk HPV Negative    Adequacy      Satisfactory for evaluation; transformation zone component PRESENT.   Diagnosis      - Negative for intraepithelial lesion or malignancy (NILM)   Comment Normal Reference Range HPV - Negative    Assessment & Plan:   Problem List Items Addressed This Visit       Respiratory   Acute non-recurrent maxillary sinusitis - Primary    Given duration of symptoms, coupled with no significant improvement, will treat for presumed bacterial involvement.  Start Augmentin antibiotics. Take 1 tablet by mouth twice daily for 7 days. Continue saline nasal rinses.  Discussed to stop Sudafed and avoid use of Afrin.  Follow up PRN      Relevant Medications   amoxicillin-clavulanate (AUGMENTIN) 875-125 MG tablet     Meds ordered this encounter  Medications   amoxicillin-clavulanate (AUGMENTIN) 875-125 MG tablet    Sig: Take 1 tablet by mouth 2 (two) times daily.    Dispense:  14 tablet    Refill:  0    Order Specific Question:   Supervising Provider    Answer:   BEDSOLE, AMY E [[id number]]   No orders of the defined types were placed in this encounter.   I discussed the assessment and treatment plan with the  patient. The patient was provided an opportunity to ask questions and all were answered. The patient agreed with the plan and demonstrated an understanding of the instructions. The patient was advised to call back or seek an in-person evaluation if the symptoms worsen or if the condition fails to improve as anticipated.  Follow up plan:  Start Augmentin antibiotics. Take 1 tablet by mouth twice daily for  7 days.  Stop taking Sudafed. Do not use Afrin longer than 3 consecutive days.  It was a pleasure to see you today!  Doreene Nest, NP

## 2022-10-24 ENCOUNTER — Encounter: Payer: Self-pay | Admitting: Primary Care

## 2022-10-24 ENCOUNTER — Ambulatory Visit (INDEPENDENT_AMBULATORY_CARE_PROVIDER_SITE_OTHER): Admitting: Primary Care

## 2022-10-24 VITALS — BP 114/60 | HR 65 | Temp 97.3°F | Ht 64.0 in | Wt 118.0 lb

## 2022-10-24 DIAGNOSIS — R7303 Prediabetes: Secondary | ICD-10-CM

## 2022-10-24 DIAGNOSIS — Z832 Family history of diseases of the blood and blood-forming organs and certain disorders involving the immune mechanism: Secondary | ICD-10-CM | POA: Insufficient documentation

## 2022-10-24 DIAGNOSIS — E785 Hyperlipidemia, unspecified: Secondary | ICD-10-CM | POA: Diagnosis not present

## 2022-10-24 DIAGNOSIS — G4700 Insomnia, unspecified: Secondary | ICD-10-CM

## 2022-10-24 DIAGNOSIS — J329 Chronic sinusitis, unspecified: Secondary | ICD-10-CM

## 2022-10-24 DIAGNOSIS — Z0000 Encounter for general adult medical examination without abnormal findings: Secondary | ICD-10-CM

## 2022-10-24 DIAGNOSIS — J4520 Mild intermittent asthma, uncomplicated: Secondary | ICD-10-CM

## 2022-10-24 DIAGNOSIS — J302 Other seasonal allergic rhinitis: Secondary | ICD-10-CM

## 2022-10-24 MED ORDER — TRAZODONE HCL 50 MG PO TABS: 100.0000 mg | ORAL_TABLET | Freq: Every day | ORAL | 3 refills | Status: DC

## 2022-10-24 MED ORDER — QNASL 80 MCG/ACT NA AERS: INHALATION_SPRAY | NASAL | 3 refills | Status: DC

## 2022-10-24 NOTE — Assessment & Plan Note (Signed)
Controlled.  Continue Trazodone 100 mg HS. Refill provided.

## 2022-10-24 NOTE — Assessment & Plan Note (Signed)
Repeat A1C pending.  Commended her on regular exercise and healthy diet.

## 2022-10-24 NOTE — Assessment & Plan Note (Signed)
In daughter.  Labs pending today.

## 2022-10-24 NOTE — Patient Instructions (Signed)
Stop by the lab prior to leaving today. I will notify you of your results once received.   It was a pleasure to see you today!  

## 2022-10-24 NOTE — Assessment & Plan Note (Signed)
Controlled.  Continue Qnasal 80 mcg nasal spray. Refills provided.

## 2022-10-24 NOTE — Assessment & Plan Note (Signed)
Overall controlled.  Continue Qnasl 80 mcg spray daily PRN

## 2022-10-24 NOTE — Assessment & Plan Note (Signed)
Repeat lipid panel pending.  Commended her on regular exercise and healthy lifestyle.

## 2022-10-24 NOTE — Progress Notes (Signed)
Subjective:    Patient ID: Kelli Snyder, female    DOB: [birth year], 62 y.o.   MRN: [id number]  HPI  Kelli Snyder is a very pleasant 62 y.o. female who presents today asfor complete physical and follow up of chronic conditions.  She is also resting testing for antiphospholipid antibody syndrome. Her daughter was recently diagnosed. She questions if she has this as she has chronic skin issues, chronic asthma, easy bruising.   Immunizations: -Tetanus: Unsure. -Shingles: Completed Shingrix series -Pneumonia: 2019  Diet: Healthy diet. Exercise: Regular exercise  Eye exam: Completes annually  Dental exam: Completes semi-annually    Pap Smear: 2023 Mammogram: August 2023  Colonoscopy: Completed in 2022, due 2032  BP Readings from Last 3 Encounters:  10/24/22 114/60  08/24/22 112/70  06/28/22 119/75       Review of Systems  Constitutional:  Negative for unexpected weight change.  HENT:  Negative for rhinorrhea.   Respiratory:  Negative for cough and shortness of breath.   Cardiovascular:  Negative for chest pain.  Gastrointestinal:  Negative for constipation and diarrhea.  Genitourinary:  Negative for difficulty urinating.  Musculoskeletal:  Positive for joint swelling. Negative for arthralgias and myalgias.  Skin:  Negative for rash.  Allergic/Immunologic: Negative for environmental allergies.  Neurological:  Negative for dizziness, numbness and headaches.  Hematological:  Bruises/bleeds easily.         Past Medical History:  Diagnosis Date   Chronic seasonal allergic rhinitis    Chronic sinusitis    Dysplastic nevus 11/12/2014   R mid helix - moderate   Dysplastic nevus 10/24/2019   R low back sacral paraspinal    Exercise-induced asthma    Migraines     Social History   Socioeconomic History   Marital status: Married    Spouse name: Not on file   Number of children: Not on file   Years of education: Not on file   Highest education level: Not on  file  Occupational History   Not on file  Tobacco Use   Smoking status: Never   Smokeless tobacco: Never  Vaping Use   Vaping Use: Never used  Substance and Sexual Activity   Alcohol use: Yes   Drug use: Never   Sexual activity: Yes  Other Topics Concern   Not on file  Social History Glass blower/designer for [employer].   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   Married.   2 children.   Social Determinants of Health   Financial Resource Strain: Not on file  Food Insecurity: Not on file  Transportation Needs: Not on file  Physical Activity: Not on file  Stress: Not on file  Social Connections: Not on file  Intimate Partner Violence: Not on file    Past Surgical History:  Procedure Laterality Date   COLONOSCOPY WITH PROPOFOL N/A 12/29/2020   Procedure: COLONOSCOPY WITH PROPOFOL;  Surgeon: Midge Minium, MD;  Location: [facility] ENDOSCOPY;  Service: Endoscopy;  Laterality: N/A;   GUM SURGERY  1991   TUBAL LIGATION  1997    Family History  Problem Relation Age of Onset   Breast cancer Mother 65       On HRT prior to diagnosis    Hyperlipidemia Mother    Melanoma Mother    Hypertension Paternal Grandmother    Cancer Paternal Grandfather     Allergies  Allergen Reactions   Sulfa Antibiotics Swelling    Swelling and itching of lips, denies breathing problems Swelling and itching of lips, denies  breathing problems     Current Outpatient Medications on File Prior to Visit  Medication Sig Dispense Refill   albuterol (VENTOLIN HFA) 108 (90 Base) MCG/ACT inhaler USE 2 INHALATIONS EVERY 6 HOURS AS NEEDED FOR WHEEZING OR SHORTNESS OF BREATH 8.5 g 13   amoxicillin-clavulanate (AUGMENTIN) 875-125 MG tablet Take 1 tablet by mouth 2 (two) times daily. 14 tablet 0   Epinastine HCl 0.05 % ophthalmic solution INSTILL 1 DROP IN BOTH EYES TWICE A DAY AS NEEDED FOR ALLERGIES 10 mL 0   EPINEPHrine 0.3 mg/0.3 mL IJ SOAJ injection Inject 0.3 mg into the muscle as needed for anaphylaxis. 1 each 0    Multiple Vitamin (MULTIVITAMIN) tablet Take 1 tablet by mouth daily.  (Patient not taking: Reported on 10/24/2022)     No current facility-administered medications on file prior to visit.    BP 114/60   Pulse 65   Temp (!) 97.3 F (36.3 C) (Temporal)   Ht 5\' 4"  (1.626 m)   Wt 118 lb (53.5 kg)   SpO2 99%   BMI 20.25 kg/m  Objective:   Physical Exam HENT:     Right Ear: Tympanic membrane and ear canal normal.     Left Ear: Tympanic membrane and ear canal normal.     Nose: Nose normal.  Eyes:     Conjunctiva/sclera: Conjunctivae normal.     Pupils: Pupils are equal, round, and reactive to light.  Neck:     Thyroid: No thyromegaly.  Cardiovascular:     Rate and Rhythm: Normal rate and regular rhythm.     Heart sounds: No murmur heard. Pulmonary:     Effort: Pulmonary effort is normal.     Breath sounds: Normal breath sounds. No rales.  Abdominal:     General: Bowel sounds are normal.     Palpations: Abdomen is soft.     Tenderness: There is no abdominal tenderness.  Musculoskeletal:        General: Normal range of motion.     Cervical back: Neck supple.     Comments: Mild swelling to right 3rd metacarpal joint to dorsal hand  Lymphadenopathy:     Cervical: No cervical adenopathy.  Skin:    General: Skin is warm and dry.     Findings: No rash.  Neurological:     Mental Status: She is alert and oriented to person, place, and time.     Cranial Nerves: No cranial nerve deficit.     Deep Tendon Reflexes: Reflexes are normal and symmetric.  Psychiatric:        Mood and Affect: Mood normal.           Assessment & Plan:  Preventative health care Assessment & Plan: Immunizations UTD. She will check on tetanus status.  Pap smear UTD. Mammogram UTD Colonoscopy UTD, due 2032  Discussed the importance of a healthy diet and regular exercise in order for weight loss, and to reduce the risk of further co-morbidity.  Exam stable. Labs pending.  Follow up in 1 year for  repeat physical.    Mild intermittent asthma without complication Assessment & Plan: Controlled.  Continue albuterol inhaler PRN. Mostly using with exercise or activity in the heat.   Chronic recurrent sinusitis Assessment & Plan: Controlled.  Continue Qnasal 80 mcg nasal spray. Refills provided.    Hyperlipidemia, unspecified hyperlipidemia type Assessment & Plan: Repeat lipid panel pending.  Commended her on regular exercise and healthy lifestyle.   Orders: -     Lipid panel -  Comprehensive metabolic panel -     CBC  Insomnia, unspecified type Assessment & Plan: Controlled.  Continue Trazodone 100 mg HS. Refill provided.  Orders: -     traZODone HCl; Take 2 tablets (100 mg total) by mouth at bedtime. For sleep  Dispense: 180 tablet; Refill: 3  Prediabetes Assessment & Plan: Repeat A1C pending.  Commended her on regular exercise and healthy diet.  Orders: -     Hemoglobin A1c  Seasonal allergies Assessment & Plan: Overall controlled.  Continue Qnasl 80 mcg spray daily PRN  Orders: -     Qnasl; USE 1 SPRAY NASALLY DAILY AS NEEDED  Dispense: 31.8 g; Refill: 3  Family history of antiphospholipid syndrome Assessment & Plan: In daughter.  Labs pending today.  Orders: -     ANA -     Cardiolipin antibodies, IgG, IgM, IgA -     Beta-2-glycoprotein i abs, IgG/M/A        Doreene Nest, NP

## 2022-10-24 NOTE — Assessment & Plan Note (Signed)
Controlled.  Continue albuterol inhaler PRN. Mostly using with exercise or activity in the heat.

## 2022-10-24 NOTE — Assessment & Plan Note (Signed)
Immunizations UTD. She will check on tetanus status.  Pap smear UTD. Mammogram UTD Colonoscopy UTD, due 2032  Discussed the importance of a healthy diet and regular exercise in order for weight loss, and to reduce the risk of further co-morbidity.  Exam stable. Labs pending.  Follow up in 1 year for repeat physical.

## 2022-10-25 LAB — COMPREHENSIVE METABOLIC PANEL
ALT: 16 U/L (ref 0–35)
AST: 25 U/L (ref 0–37)
Albumin: 4.5 g/dL (ref 3.5–5.2)
Alkaline Phosphatase: 57 U/L (ref 39–117)
BUN: 27 mg/dL — ABNORMAL HIGH (ref 6–23)
CO2: 27 mEq/L (ref 19–32)
Calcium: 10.1 mg/dL (ref 8.4–10.5)
Chloride: 100 mEq/L (ref 96–112)
Creatinine, Ser: 1.32 mg/dL — ABNORMAL HIGH (ref 0.40–1.20)
GFR: 44.17 mL/min — ABNORMAL LOW (ref >60.00)
Glucose, Bld: 106 mg/dL — ABNORMAL HIGH (ref 70–99)
Potassium: 4.2 mEq/L (ref 3.5–5.1)
Sodium: 138 mEq/L (ref 135–145)
Total Bilirubin: 0.6 mg/dL (ref 0.2–1.2)
Total Protein: 7 g/dL (ref 6.0–8.3)

## 2022-10-25 LAB — LIPID PANEL
Cholesterol: 278 mg/dL — ABNORMAL HIGH (ref 0–200)
HDL: 96.7 mg/dL (ref >39.00)
LDL Cholesterol: 160 mg/dL — ABNORMAL HIGH (ref 0–99)
NonHDL: 181.59
Total CHOL/HDL Ratio: 3
Triglycerides: 109 mg/dL (ref 0.0–149.0)
VLDL: 21.8 mg/dL (ref 0.0–40.0)

## 2022-10-25 LAB — HEMOGLOBIN A1C: Hgb A1c MFr Bld: 5.9 % (ref 4.6–6.5)

## 2022-10-25 LAB — CBC
HCT: 43.6 % (ref 36.0–46.0)
Hemoglobin: 14.3 g/dL (ref 12.0–15.0)
MCHC: 32.8 g/dL (ref 30.0–36.0)
MCV: 93.1 fl (ref 78.0–100.0)
Platelets: 254 10*3/uL (ref 150.0–400.0)
RBC: 4.69 Mil/uL (ref 3.87–5.11)
RDW: 14 % (ref 11.5–15.5)
WBC: 6 10*3/uL (ref 4.0–10.5)

## 2022-10-26 DIAGNOSIS — N289 Disorder of kidney and ureter, unspecified: Secondary | ICD-10-CM

## 2022-10-27 LAB — BETA-2-GLYCOPROTEIN I ABS, IGG/M/A
Beta-2 Glyco 1 IgA: <9 GPI IgA units (ref 0–25)
Beta-2 Glyco 1 IgM: <9 GPI IgM units (ref 0–32)
Beta-2 Glyco I IgG: <9 GPI IgG units (ref 0–20)

## 2022-10-28 LAB — CARDIOLIPIN ANTIBODIES, IGG, IGM, IGA
Anticardiolipin IgA: <2 APL-U/mL (ref <20.0)
Anticardiolipin IgG: <2 GPL-U/mL (ref <20.0)
Anticardiolipin IgM: <2 MPL-U/mL (ref <20.0)

## 2022-10-28 LAB — ANA: Anti Nuclear Antibody (ANA): NEGATIVE

## 2022-10-31 DIAGNOSIS — G4700 Insomnia, unspecified: Secondary | ICD-10-CM

## 2022-11-02 MED ORDER — TRAZODONE HCL 50 MG PO TABS: 100.0000 mg | ORAL_TABLET | Freq: Every day | ORAL | 3 refills | Status: DC

## 2022-11-07 ENCOUNTER — Telehealth: Payer: Self-pay | Admitting: Primary Care

## 2022-11-07 DIAGNOSIS — N289 Disorder of kidney and ureter, unspecified: Secondary | ICD-10-CM

## 2022-11-07 DIAGNOSIS — W57XXXS Bitten or stung by nonvenomous insect and other nonvenomous arthropods, sequela: Secondary | ICD-10-CM

## 2022-11-07 DIAGNOSIS — G4700 Insomnia, unspecified: Secondary | ICD-10-CM

## 2022-11-07 NOTE — Telephone Encounter (Signed)
A week ago Saturday she went hiking and the she found a tick was attached for over 24 hours. She first had a red ring around the area, and that has went away now it is still a hard red lump. Was attached on the right mid back. Denies fever, chills, headache, body aches, joint pain.

## 2022-11-07 NOTE — Telephone Encounter (Signed)
Please thank her for those details.  It does not sound like she has Lyme disease.  It is too early to complete the lab test as an early lab test can show a false positive or negative result.  Recommend she set up a lab only appointment for 3 weeks from now and we can test her.  Have her call us sooner if she develops any of the symptoms previously mentioned, or if the rash returns.

## 2022-11-07 NOTE — Telephone Encounter (Signed)
Patient called in and wanted to know if a lab for lyme disease could be added on.  She stated that she had a suspected tick bite. Thank you!

## 2022-11-07 NOTE — Telephone Encounter (Signed)
Unable to reach patient. Left voicemail to return call to our office.   

## 2022-11-07 NOTE — Telephone Encounter (Signed)
Please call patient:  When was the suspected tick bite?  Any symptoms of fevers, chills, body aches, headaches?

## 2022-11-08 ENCOUNTER — Other Ambulatory Visit (INDEPENDENT_AMBULATORY_CARE_PROVIDER_SITE_OTHER)

## 2022-11-08 DIAGNOSIS — N289 Disorder of kidney and ureter, unspecified: Secondary | ICD-10-CM | POA: Diagnosis not present

## 2022-11-09 ENCOUNTER — Other Ambulatory Visit

## 2022-11-09 LAB — BASIC METABOLIC PANEL
BUN: 24 mg/dL — ABNORMAL HIGH (ref 6–23)
CO2: 28 mEq/L (ref 19–32)
Calcium: 9.8 mg/dL (ref 8.4–10.5)
Chloride: 101 mEq/L (ref 96–112)
Creatinine, Ser: 1.24 mg/dL — ABNORMAL HIGH (ref 0.40–1.20)
GFR: 47.6 mL/min — ABNORMAL LOW (ref >60.00)
Glucose, Bld: 100 mg/dL — ABNORMAL HIGH (ref 70–99)
Potassium: 4.2 mEq/L (ref 3.5–5.1)
Sodium: 137 mEq/L (ref 135–145)

## 2022-11-09 NOTE — Telephone Encounter (Signed)
Unable to reach patient. Left voicemail to return call to our office.   

## 2022-11-09 NOTE — Telephone Encounter (Signed)
Patient requested mychart message with response as its hard for her to answer the phone during the day.

## 2022-11-09 NOTE — Telephone Encounter (Signed)
Called and spoke with patient, reviewed all information. She verbalized understanding.   Regarding lab results- If Kelli Snyder thinks it is okay patient would like to wait 3 weeks and then recheck kidneys, and at that point decide if she would like to do an ultrasound. Scheduled lab appt pending approval from [place].

## 2022-11-10 NOTE — Telephone Encounter (Signed)
Noted. Mychart message sent.

## 2022-11-10 NOTE — Addendum Note (Signed)
Addended by: Doreene Nest on: 11/10/2022 07:06 AM   Modules accepted: Orders

## 2022-12-02 ENCOUNTER — Other Ambulatory Visit

## 2022-12-08 ENCOUNTER — Other Ambulatory Visit

## 2022-12-09 ENCOUNTER — Other Ambulatory Visit (INDEPENDENT_AMBULATORY_CARE_PROVIDER_SITE_OTHER)

## 2022-12-09 DIAGNOSIS — W57XXXS Bitten or stung by nonvenomous insect and other nonvenomous arthropods, sequela: Secondary | ICD-10-CM

## 2022-12-09 DIAGNOSIS — G4700 Insomnia, unspecified: Secondary | ICD-10-CM

## 2022-12-09 DIAGNOSIS — N289 Disorder of kidney and ureter, unspecified: Secondary | ICD-10-CM

## 2022-12-09 NOTE — Addendum Note (Signed)
Addended by: Vincenza Hews on: 12/09/2022 03:49 PM   Modules accepted: Orders

## 2022-12-27 ENCOUNTER — Ambulatory Visit (INDEPENDENT_AMBULATORY_CARE_PROVIDER_SITE_OTHER): Admitting: Vascular Surgery

## 2023-01-05 ENCOUNTER — Other Ambulatory Visit: Payer: Self-pay | Admitting: Primary Care

## 2023-01-05 DIAGNOSIS — Z1231 Encounter for screening mammogram for malignant neoplasm of breast: Secondary | ICD-10-CM

## 2023-01-12 ENCOUNTER — Ambulatory Visit (INDEPENDENT_AMBULATORY_CARE_PROVIDER_SITE_OTHER): Admitting: Dermatology

## 2023-01-12 ENCOUNTER — Encounter: Payer: Self-pay | Admitting: Dermatology

## 2023-01-12 DIAGNOSIS — Z808 Family history of malignant neoplasm of other organs or systems: Secondary | ICD-10-CM

## 2023-01-12 DIAGNOSIS — D229 Melanocytic nevi, unspecified: Secondary | ICD-10-CM

## 2023-01-12 DIAGNOSIS — W908XXA Exposure to other nonionizing radiation, initial encounter: Secondary | ICD-10-CM | POA: Diagnosis not present

## 2023-01-12 DIAGNOSIS — Z1283 Encounter for screening for malignant neoplasm of skin: Secondary | ICD-10-CM | POA: Diagnosis not present

## 2023-01-12 DIAGNOSIS — L700 Acne vulgaris: Secondary | ICD-10-CM | POA: Diagnosis not present

## 2023-01-12 DIAGNOSIS — Z79899 Other long term (current) drug therapy: Secondary | ICD-10-CM

## 2023-01-12 DIAGNOSIS — L814 Other melanin hyperpigmentation: Secondary | ICD-10-CM

## 2023-01-12 DIAGNOSIS — I8393 Asymptomatic varicose veins of bilateral lower extremities: Secondary | ICD-10-CM

## 2023-01-12 DIAGNOSIS — Z86018 Personal history of other benign neoplasm: Secondary | ICD-10-CM

## 2023-01-12 DIAGNOSIS — I781 Nevus, non-neoplastic: Secondary | ICD-10-CM

## 2023-01-12 DIAGNOSIS — L578 Other skin changes due to chronic exposure to nonionizing radiation: Secondary | ICD-10-CM

## 2023-01-12 DIAGNOSIS — L820 Inflamed seborrheic keratosis: Secondary | ICD-10-CM | POA: Diagnosis not present

## 2023-01-12 DIAGNOSIS — L821 Other seborrheic keratosis: Secondary | ICD-10-CM

## 2023-01-12 DIAGNOSIS — Z7189 Other specified counseling: Secondary | ICD-10-CM

## 2023-01-12 MED ORDER — WINLEVI 1 % EX CREA: TOPICAL_CREAM | CUTANEOUS | 3 refills | Status: DC

## 2023-01-12 NOTE — Patient Instructions (Signed)
Start Winlevi cream twice daily to face for acne.   Your prescription was sent to [facility] in [place]. A representative from [facility] will contact you within 3 business hours to verify your address and insurance information to schedule a free delivery. If for any reason you do not receive a phone call from them, please reach out to them. Their phone number is [phone] and their hours are Monday-Friday 9:00 am-5:00 pm.     Cryotherapy Aftercare  Wash gently with soap and water everyday.   Apply Vaseline and Band-Aid daily until healed.   Recommend daily broad spectrum sunscreen SPF 30+ to sun-exposed areas, reapply every 2 hours as needed. Call for new or changing lesions.  Staying in the shade or wearing long sleeves, sun glasses (UVA+UVB protection) and wide brim hats (4-inch brim around the entire circumference of the hat) are also recommended for sun protection.   Melanoma ABCDEs  Melanoma is the most dangerous type of skin cancer, and is the leading cause of death from skin disease.  You are more likely to develop melanoma if you: Have light-colored skin, light-colored eyes, or red or blond hair Spend a lot of time in the sun Tan regularly, either outdoors or in a tanning bed Have had blistering sunburns, especially during childhood Have a close family member who has had a melanoma Have atypical moles or large birthmarks  Early detection of melanoma is key since treatment is typically straightforward and cure rates are extremely high if we catch it early.   The first sign of melanoma is often a change in a mole or a new dark spot.  The ABCDE system is a way of remembering the signs of melanoma.  A for asymmetry:  The two halves do not match. B for border:  The edges of the growth are irregular. C for color:  A mixture of colors are present instead of an even brown color. D for diameter:  Melanomas are usually (but not always) greater than 6mm - the size of a  pencil eraser. E for evolution:  The spot keeps changing in size, shape, and color.  Please check your skin once per month between visits. You can use a small mirror in front and a large mirror behind you to keep an eye on the back side or your body.   If you see any new or changing lesions before your next follow-up, please call to schedule a visit.  Please continue daily skin protection including broad spectrum sunscreen SPF 30+ to sun-exposed areas, reapplying every 2 hours as needed when you're outdoors.   Staying in the shade or wearing long sleeves, sun glasses (UVA+UVB protection) and wide brim hats (4-inch brim around the entire circumference of the hat) are also recommended for sun protection.     Due to recent changes in healthcare laws, you may see results of your pathology and/or laboratory studies on MyChart before the doctors have had a chance to review them. We understand that in some cases there may be results that are confusing or concerning to you. Please understand that not all results are received at the same time and often the doctors may need to interpret multiple results in order to provide you with the best plan of care or course of treatment. Therefore, we ask that you please give [place] 2 business days to thoroughly review all your results before contacting the office for clarification. Should we see a critical lab result, you will be contacted sooner.  If You Need Anything After Your Visit  If you have any questions or concerns for your doctor, please call our main line at [phone] and press option 4 to reach your doctor's medical assistant. If no one answers, please leave a voicemail as directed and we will return your call as soon as possible. Messages left after 4 pm will be answered the following business day.   You may also send [place] a message via MyChart. We typically respond to MyChart messages within 1-2 business days.  For prescription refills, please ask your  pharmacy to contact our office. Our fax number is [phone].  If you have an urgent issue when the clinic is closed that cannot wait until the next business day, you can page your doctor at the number below.    Please note that while we do our best to be available for urgent issues outside of office hours, we are not available 24/7.   If you have an urgent issue and are unable to reach [place], you may choose to seek medical care at your doctor's office, retail clinic, urgent care center, or emergency room.  If you have a medical emergency, please immediately call 911 or go to the emergency department.  Pager Numbers  - Dr. Gwen Pounds: [phone]  - Dr. Roseanne Reno: [phone]  - Dr. Katrinka Blazing: [phone]   In the event of inclement weather, please call our main line at [phone] for an update on the status of any delays or closures.  Dermatology Medication Tips: Please keep the boxes that topical medications come in in order to help keep track of the instructions about where and how to use these. Pharmacies typically print the medication instructions only on the boxes and not directly on the medication tubes.   If your medication is too expensive, please contact our office at [phone] option 4 or send [place] a message through MyChart.   We are unable to tell what your co-pay for medications will be in advance as this is different depending on your insurance coverage. However, we may be able to find a substitute medication at lower cost or fill out paperwork to get insurance to cover a needed medication.   If a prior authorization is required to get your medication covered by your insurance company, please allow [place] 1-2 business days to complete this process.  Drug prices often vary depending on where the prescription is filled and some pharmacies may offer cheaper prices.  The website www.goodrx.com contains coupons for medications through different pharmacies. The prices here do not  account for what the cost may be with help from insurance (it may be cheaper with your insurance), but the website can give you the price if you did not use any insurance.  - You can print the associated coupon and take it with your prescription to the pharmacy.  - You may also stop by our office during regular business hours and pick up a GoodRx coupon card.  - If you need your prescription sent electronically to a different pharmacy, notify our office through [facility] or by phone at [phone] option 4.     Si Usted Necesita Algo Despus de Su Visita  Tambin puede enviarnos un mensaje a travs de Clinical cytogeneticist. Por lo general respondemos a los mensajes de MyChart en el transcurso de 1 a 2 das hbiles.  Para renovar recetas, por favor pida a su farmacia que se ponga en contacto con nuestra oficina. Annie Sable de fax es [place] [phone].  Si tiene un asunto urgente cuando la clnica est cerrada y que no puede esperar hasta el siguiente da hbil, puede llamar/localizar a su doctor(a) al nmero que aparece a continuacin.   Por favor, tenga en cuenta que aunque hacemos todo lo posible para estar disponibles para asuntos urgentes fuera del horario de [place], no estamos disponibles las 24 horas del da, los 7 [address]  [address] de la [place].   Si tiene un problema urgente y no puede comunicarse con nosotros, puede optar por buscar atencin mdica  en el consultorio de su doctor(a), en una clnica privada, en un centro de atencin urgente o en una sala de emergencias.  Si tiene Engineer, drilling, por favor llame inmediatamente al 911 o vaya a la sala de emergencias.  Nmeros de bper  - Dr. Gwen Pounds: [phone]  - Dra. Roseanne Reno: [phone]  - Dr. Katrinka Blazing: [phone]   En caso de inclemencias del tiempo, por favor llame a Lacy Duverney principal al [phone] para una actualizacin sobre el [place] de cualquier retraso o cierre.  Consejos para la medicacin en dermatologa: Por  favor, guarde las cajas en las que vienen los medicamentos de uso tpico para ayudarle a seguir las instrucciones sobre dnde y cmo usarlos. Las farmacias generalmente imprimen las instrucciones del medicamento slo en las cajas y no directamente en los tubos del [place].   Si su medicamento es muy caro, por favor, pngase en contacto con Rolm Gala llamando al [phone] y presione la opcin 4 o envenos un mensaje a travs de Clinical cytogeneticist.   No podemos decirle cul ser su copago por los medicamentos por adelantado ya que esto es diferente dependiendo de la cobertura de su seguro. Sin embargo, es posible que podamos encontrar un medicamento sustituto a Audiological scientist un formulario para que el seguro cubra el medicamento que se considera necesario.   Si se requiere una autorizacin previa para que su compaa de seguros [place] su medicamento, por favor permtanos de 1 a 2 das hbiles para completar [address]  Los precios de los medicamentos varan con frecuencia dependiendo del Environmental consultant de dnde se surte la receta y alguna farmacias pueden ofrecer precios ms baratos.  El sitio web www.goodrx.com tiene cupones para medicamentos de Health and safety inspector. Los precios aqu no tienen en cuenta lo que podra costar con la ayuda del seguro (puede ser ms barato con su seguro), pero el sitio web puede darle el precio si no utiliz Tourist information centre manager.  - Puede imprimir el cupn correspondiente y llevarlo con su receta a la farmacia.  - Tambin puede pasar por nuestra oficina durante el horario de atencin regular y Education officer, museum una tarjeta de cupones de GoodRx.  - Si necesita que su receta se enve electrnicamente a una farmacia diferente, informe a nuestra oficina a travs de MyChart de [place] o por telfono llamando al [phone] y presione la opcin 4.

## 2023-01-12 NOTE — Progress Notes (Signed)
Follow-Up Visit   Subjective  Kelli Snyder is a 62 y.o. female who presents for the following: Skin Cancer Screening and Full Body Skin Exam. Hx of dysplastic nevi. Mother has Hx of MM.  The patient presents for Total-Body Skin Exam (TBSE) for skin cancer screening and mole check. The patient has spots, moles and lesions to be evaluated, some may be new or changing and the patient may have concern these could be cancer.    The following portions of the chart were reviewed this encounter and updated as appropriate: medications, allergies, medical history  Review of Systems:  No other skin or systemic complaints except as noted in HPI or Assessment and Plan.  Objective  Well appearing patient in no apparent distress; mood and affect are within normal limits.  A full examination was performed including scalp, head, eyes, ears, nose, lips, neck, chest, axillae, abdomen, back, buttocks, bilateral upper extremities, bilateral lower extremities, hands, feet, fingers, toes, fingernails, and toenails. All findings within normal limits unless otherwise noted below.   Relevant physical exam findings are noted in the Assessment and Plan.  Right bicep x1 Erythematous keratotic or waxy stuck-on papule or plaque.        Assessment & Plan   FAMILY HISTORY OF SKIN CANCER What type(s): MM Who affected: Mother   HISTORY OF DYSPLASTIC NEVI No evidence of recurrence today Recommend regular full body skin exams Recommend daily broad spectrum sunscreen SPF 30+ to sun-exposed areas, reapply every 2 hours as needed.  Call if any new or changing lesions are noted between office visits   SKIN CANCER SCREENING PERFORMED TODAY.  ACTINIC DAMAGE - Chronic condition, secondary to cumulative UV/sun exposure - diffuse scaly erythematous macules with underlying dyspigmentation - Recommend daily broad spectrum sunscreen SPF 30+ to sun-exposed areas, reapply every 2 hours as needed.  - Staying in the  shade or wearing long sleeves, sun glasses (UVA+UVB protection) and wide brim hats (4-inch brim around the entire circumference of the hat) are also recommended for sun protection.  - Call for new or changing lesions.  LENTIGINES, SEBORRHEIC KERATOSES, HEMANGIOMAS - Benign normal skin lesions - Benign-appearing - Call for any changes  MELANOCYTIC NEVI - Tan-brown and/or pink-flesh-colored symmetric macules and papules - Benign appearing on exam today - Observation - Call clinic for new or changing moles - Recommend daily use of broad spectrum spf 30+ sunscreen to sun-exposed areas.   MELANOCYTIC NEVUS Exam: 2 mm brown macule  Treatment Plan: Benign appearing on exam today. Recommend observation. Call clinic for new or changing moles. Recommend daily use of broad spectrum spf 30+ sunscreen to sun-exposed areas.   Benign-appearing. Stable compared to previous visit. Observation.  Call clinic for new or changing moles.  Recommend daily use of broad spectrum spf 30+ sunscreen to sun-exposed areas.    Varicose Veins/Spider Veins - Dilated blue, purple or red veins at the lower extremities - Reassured - Smaller vessels can be treated by sclerotherapy (a procedure to inject a medicine into the veins to make them disappear) if desired, but the treatment is not covered by insurance. Larger vessels may be covered if symptomatic and we would refer to vascular surgeon if treatment desired. -Examined by vascular surgeon, advised WNL, can treat cosmetically.    Inflamed seborrheic keratosis Right bicep x1  Symptomatic, irritating, patient would like treated.  Destruction of lesion - Right bicep x1 Complexity: simple   Destruction method: cryotherapy   Informed consent: discussed and consent obtained   Timeout:  patient name, date of birth, surgical site, and procedure verified Lesion destroyed using liquid nitrogen: Yes   Region frozen until ice ball extended beyond lesion: Yes   Outcome:  patient tolerated procedure well with no complications   Post-procedure details: wound care instructions given   Additional details:  Prior to procedure, discussed risks of blister formation, small wound, skin dyspigmentation, or rare scar following cryotherapy. Recommend Vaseline ointment to treated areas while healing.   Acne vulgaris Head - Anterior (Face) Chronic and persistent condition with duration or expected duration over one year. Condition is symptomatic / bothersome to patient. Not to goal. Papules of the mandible Start Winlevi cream twice daily to face for acne. Clascoterone (WINLEVI) 1 % CREA - Head - Anterior (Face) Apply twice daily to face for acne  Return in about 1 year (around 01/12/2024) for TBSE, HxDN, Fm Hx MM.  I, Lawson Radar, CMA, am acting as scribe for Armida Sans, MD.   Documentation: I have reviewed the above documentation for accuracy and completeness, and I agree with the above.  Armida Sans, MD

## 2023-01-18 ENCOUNTER — Encounter

## 2023-02-01 ENCOUNTER — Ambulatory Visit
Admission: RE | Admit: 2023-02-01 | Discharge: 2023-02-01 | Disposition: A | Discharge status: Home / Self Care | Source: Ambulatory Visit | Attending: Primary Care | Admitting: Primary Care

## 2023-02-01 DIAGNOSIS — Z1231 Encounter for screening mammogram for malignant neoplasm of breast: Secondary | ICD-10-CM | POA: Diagnosis present

## 2023-04-04 ENCOUNTER — Telehealth: Admitting: General Practice

## 2023-04-04 ENCOUNTER — Encounter: Payer: Self-pay | Admitting: General Practice

## 2023-04-04 VITALS — Ht 64.0 in | Wt 117.0 lb

## 2023-04-04 DIAGNOSIS — J0100 Acute maxillary sinusitis, unspecified: Secondary | ICD-10-CM | POA: Diagnosis not present

## 2023-04-04 MED ORDER — AMOXICILLIN-POT CLAVULANATE 875-125 MG PO TABS
1.0000 | ORAL_TABLET | Freq: Two times a day (BID) | ORAL | 0 refills | Status: AC
Start: 2023-04-04 — End: 2023-04-11

## 2023-04-04 NOTE — Assessment & Plan Note (Signed)
Given the length of her symptoms and with no improvement with over-the-counter medications, we will go ahead and treat for bacterial sinusitis.   Start Augmentin.  Take 1 tablet by mouth twice daily for 7 days.   Can continue saline nasal spray or Flonase.   Recommended to patient to stop using Sudafed.   She will update if her symptoms do not improve or worsen.

## 2023-04-04 NOTE — Progress Notes (Signed)
Virtual Visit via Video Note  I connected with Kelli Snyder on 04/04/23 at 12:40 PM EST by a video enabled telemedicine application and verified that I am speaking with the correct person using two identifiers.  Patient Location: Home Provider Location: Office/Clinic  I discussed the limitations, risks, security, and privacy concerns of performing an evaluation and management service by video and the availability of in person appointments. I also discussed with the patient that there may be a patient responsible charge related to this service. The patient expressed understanding and agreed to proceed.  Subjective: PCP: Doreene Nest, NP  Chief Complaint  Patient presents with   Headache    And congestion x 2 weeks. Did have a cold about a month ago. Has been taking advil for the headache and saline and sudafed for congestion but hasn't really helped. Has not done any testing at home.    HPI  Kelli Snyder is a 62 year old female, patient of Vernona Rieger, NP,  with past medical history of seasonal allergies, chronic recurrent sinusitis, presenting today for virtual visit for headache and congestion.   She reports that her symptom onset was 2 to 3 weeks ago but has gotten worse over the last week. Started with nasal congestion but now has developed left maxillary and frontal sinus pressure.  She has been using saline nasal spray Sudafed and Advil with minimal relief.   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   She denies any fever or chills chest pain shortness of breath difficulty breathing or cough. She has not done a COVID test at home.    ROS: Per HPI  Current Outpatient Medications:    albuterol (VENTOLIN HFA) 108 (90 Base) MCG/ACT inhaler, USE 2 INHALATIONS EVERY 6 HOURS AS NEEDED FOR WHEEZING OR SHORTNESS OF BREATH, Disp: 8.5 g, Rfl: 13   amoxicillin-clavulanate (AUGMENTIN) 875-125 MG tablet, Take 1 tablet by mouth 2 (two)  times daily for 7 days., Disp: 14 tablet, Rfl: 0   EPINEPHrine 0.3 mg/0.3 mL IJ SOAJ injection, Inject 0.3 mg into the muscle as needed for anaphylaxis., Disp: 1 each, Rfl: 0   QNASL 80 MCG/ACT AERS, USE 1 SPRAY NASALLY DAILY AS NEEDED, Disp: 31.8 g, Rfl: 3   traZODone (DESYREL) 50 MG tablet, Take 2 tablets (100 mg total) by mouth at bedtime. For sleep, Disp: 180 tablet, Rfl: 3  Observations/Objective: Today's Vitals   04/04/23 1225  Weight: 117 lb (53.1 kg)  Height: 5\' 4"  (1.626 m)   Physical exam:   General: Alert and oriented x 3, no distress, does not appear sickly.   Pulmonary: Speaks in complete sentences without increased work of breathing, no cough during visit.   Psychiatric: Normal mood, thought content and behavior.    Assessment and Plan: Acute non-recurrent maxillary sinusitis Assessment & Plan: Given the length of her symptoms and with no improvement with over-the-counter medications, we will go ahead and treat for bacterial sinusitis.   Start Augmentin.  Take 1 tablet by mouth twice daily for 7 days.   Can continue saline nasal spray or Flonase.   Recommended to patient to stop using Sudafed.   She will update if her symptoms do not improve or worsen.   Orders: -     Amoxicillin-Pot Clavulanate; Take 1 tablet by mouth 2 (two) times daily for 7 days.  Dispense: 14 tablet; Refill: 0    Follow Up Instructions: Return if symptoms worsen or fail to improve.  I discussed the assessment and treatment plan with the patient. The patient was provided an opportunity to ask questions, and all were answered. The patient agreed with the plan and demonstrated an understanding of the instructions.   The patient was advised to call back or seek an in-person evaluation if the symptoms worsen or if the condition fails to improve as anticipated.  The above assessment and management plan was discussed with the patient. The patient verbalized understanding of and has agreed to  the management plan.   Modesto Charon, NP

## 2023-04-04 NOTE — Patient Instructions (Addendum)
Start Amoxicillin-Pot Clavulanate (Augmentin); Take 1 tablet two times a day for 7 days.   You can try a few things over the counter to help with your symptoms including:  Cough: Delsym or Robitussin (get the off brand, works just as well) Chest Congestion: Mucinex (plain) Nasal Congestion/Ear Pressure/Sinus Pressure: Try using Flonase (fluticasone) nasal spray. Instill 1 spray in each nostril twice daily. This can be purchased over the counter. Body aches, fevers, headache: Ibuprofen (not to exceed 2400 mg in 24 hours) or Acetaminophen-Tylenol (not to exceed 3000 mg in 24 hours) Runny Nose/Throat Drainage/Sneezing/Itchy or Watery Eyes: An antihistamine such as Zyrtec, Claritin, Xyzal, Allegra.   Please update if your symptoms do not improve or worsen.   It was a pleasure meeting you!

## 2023-04-13 ENCOUNTER — Telehealth: Payer: Self-pay | Admitting: Primary Care

## 2023-04-13 NOTE — Telephone Encounter (Signed)
Patient was seen on 12/3 and treated for a sinus infection.She called in today stating that she has finished the antibiotics,however she still has some congestion and her left eye was swollen on yesterday.She would like to know if she needs to come back in to be reevaluated or if another round of antibiotics may need to be sent in for her?

## 2023-04-13 NOTE — Telephone Encounter (Signed)
LMTCB to schedule appt for recheck.

## 2023-04-13 NOTE — Telephone Encounter (Signed)
Spoke to pt, told pt Kaur's response. Pt states she'll call back if she needs another appt. Call back # [phone]

## 2023-05-08 ENCOUNTER — Other Ambulatory Visit: Payer: Self-pay | Admitting: Primary Care

## 2023-05-08 DIAGNOSIS — J4520 Mild intermittent asthma, uncomplicated: Secondary | ICD-10-CM

## 2023-05-19 ENCOUNTER — Ambulatory Visit: Admitting: Primary Care

## 2023-05-19 ENCOUNTER — Encounter: Payer: Self-pay | Admitting: Primary Care

## 2023-05-19 ENCOUNTER — Ambulatory Visit: Payer: Self-pay | Admitting: Primary Care

## 2023-05-19 VITALS — BP 126/72 | HR 65 | Temp 97.8°F | Ht 64.0 in | Wt 123.0 lb

## 2023-05-19 DIAGNOSIS — J0100 Acute maxillary sinusitis, unspecified: Secondary | ICD-10-CM | POA: Diagnosis not present

## 2023-05-19 MED ORDER — AZITHROMYCIN 250 MG PO TABS
ORAL_TABLET | ORAL | 0 refills | Status: DC
Start: 2023-05-19 — End: 2023-08-23

## 2023-05-19 MED ORDER — PREDNISONE 20 MG PO TABS
ORAL_TABLET | ORAL | 0 refills | Status: DC
Start: 2023-05-19 — End: 2023-08-23

## 2023-05-19 NOTE — Assessment & Plan Note (Signed)
Not quite convinced that her symptoms are bacterial, but given her recent fever we will treat.  Start Azithromycin antibiotics for infection. Take 2 tablets by mouth today, then 1 tablet daily for 4 additional days.  Start prednisone 20 mg tablets. Take 2 tablets by mouth once daily in the morning for 5 days.  We discussed to use Sudafed sparingly.

## 2023-05-19 NOTE — Patient Instructions (Signed)
Start Azithromycin antibiotics for infection. Take 2 tablets by mouth today, then 1 tablet daily for 4 additional days.  Start prednisone 20 mg tablets. Take 2 tablets by mouth once daily in the morning for 5 days.  It was a pleasure to see you today!

## 2023-05-19 NOTE — Telephone Encounter (Signed)
Copied from CRM [id number]. Topic: Clinical - Red Word Triage >> May 19, 2023  8:05 AM Deaijah H wrote: Red Word that prompted transfer to Nurse Triage: Patient called in due to swollen eyes and sinus pressure for a week & Congested   Chief Complaint: sinus congestion Symptoms: nasal congestion; puffy eyes, headache, ears stuffy Frequency: constant Pertinent Negatives: Patient denies fever Disposition: [] ED /[] Urgent Care (no appt availability in office) / [x] Appointment(In office/virtual)/ []  [facility]/ [] Home Care/ [] Refused Recommended Disposition /[] [facility]/ []  Follow-up with PCP Additional Notes: Patient was seen on 12/03 for same issue, treated with abx, but sinus congestion persisted. Pt states that she gets sinus infections about 2x a year but this has lingered for 6 weeks and now her eyes are a little puffy. Appt scheduled for tthis afternoon  Reason for Disposition  [1] Sinus congestion (pressure, fullness) AND [2] present > 10 days  Answer Assessment - Initial Assessment Questions 1. LOCATION: "Where does it hurt?"      Behind sinuses, forehead  2. ONSET: "When did the sinus pain start?"  (e.g., hours, days)      Has been going on for 6 weeks  3. SEVERITY: "How bad is the pain?"   (Scale 1-10; mild, moderate or severe)   - MILD (1-3): doesn't interfere with normal activities    - MODERATE (4-7): interferes with normal activities (e.g., work or school) or awakens from sleep   - SEVERE (8-10): excruciating pain and patient unable to do any normal activities        5-6/10 pain, has been uncofmortable  4. RECURRENT SYMPTOM: "Have you ever had sinus problems before?" If Yes, ask: "When was the last time?" and "What happened that time?"      Get sinus infectiosn 2x a year  5. NASAL CONGESTION: "Is the nose blocked?" If Yes, ask: "Can you open it or must you breathe through your mouth?"     Yes  6. NASAL DISCHARGE: "Do you have discharge from your  nose?" If so ask, "What color?"     Does not have a runny nose  7. FEVER: "Do you have a fever?" If Yes, ask: "What is it, how was it measured, and when did it start?"      No fevers  8. OTHER SYMPTOMS: "Do you have any other symptoms?" (e.g., sore throat, cough, earache, difficulty breathing)     Stuffy ear on left, eyes are getting puffy  9. PREGNANCY: "Is there any chance you are pregnant?" "When was your last menstrual period?"     No  Protocols used: Sinus Pain or Congestion-A-AH

## 2023-05-19 NOTE — Telephone Encounter (Signed)
 Noted.  Will evaluate.

## 2023-05-19 NOTE — Progress Notes (Signed)
Subjective:    Patient ID: Kelli Snyder, female    DOB: November 21, 1960, 63 y.o.   MRN: [id number]  HPI  Kelli Snyder is a very pleasant 63 y.o. female with a history of chronic sinusitis, asthma, seasonal allergies who presents today to discuss sinus pressure.  Evaluated virtually by Norma Fredrickson, NP on 04/04/2023 for 2-week history of headaches, nasal congestion.  She was diagnosed for acute sinusitis and treated with Augmentin twice daily x 7 days.  She was encouraged to use Flonase and advised to stop Sudafed.  Today she discusses symptoms of continued sinus pressure with swelling just below both eyes, around the nose. She's also noticed fatigue, low grade fever last night, bilateral ear fullness. Symptoms worsened about 1 week ago. Prior to that she was outdoors hiking.   She's been taking Sudafed and Advil recently. She uses Sudafed sparingly in general.   She completed her course of Augmentin she felt better, but never felt her symptoms resolve.   Review of Systems  Constitutional:  Positive for chills, fatigue and fever.  HENT:  Positive for congestion, sinus pressure and sinus pain. Negative for ear pain and postnasal drip.   Respiratory:  Negative for cough.   Allergic/Immunologic: Positive for environmental allergies.         Past Medical History:  Diagnosis Date   Chronic seasonal allergic rhinitis    Chronic sinusitis    Dysplastic nevus 11/12/2014   R mid helix - moderate   Dysplastic nevus 10/24/2019   R low back sacral paraspinal    Exercise-induced asthma    Migraines     Social History   Socioeconomic History   Marital status: Married    Spouse name: Not on file   Number of children: Not on file   Years of education: Not on file   Highest education level: Not on file  Occupational History   Not on file  Tobacco Use   Smoking status: Never   Smokeless tobacco: Never  Vaping Use   Vaping status: Never Used  Substance and Sexual Activity   Alcohol use:  Yes   Drug use: Never   Sexual activity: Yes  Other Topics Concern   Not on file  Social History Glass blower/designer for [employer]   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   Married.   2 children.   Social Drivers of Corporate investment banker Strain: Not on file  Food Insecurity: Not on file  Transportation Needs: Not on file  Physical Activity: Not on file  Stress: Not on file  Social Connections: Not on file  Intimate Partner Violence: Not on file    Past Surgical History:  Procedure Laterality Date   COLONOSCOPY WITH PROPOFOL N/A 12/29/2020   Procedure: COLONOSCOPY WITH PROPOFOL;  Surgeon: Midge Minium, MD;  Location[facility] ENDOSCOPY;  Service: Endoscopy;  Laterality: N/A;   GUM SURGERY  1991   TUBAL LIGATION  1997    Family History  Problem Relation Age of Onset   Breast cancer Mother 48       On HRT prior to diagnosis    Hyperlipidemia Mother    Melanoma Mother    Hypertension Paternal Grandmother    Cancer Paternal Grandfather     Allergies  Allergen Reactions   Sulfa Antibiotics Swelling    Swelling and itching of lips, denies breathing problems Swelling and itching of lips, denies breathing problems     Current Outpatient Medications on File Prior to Visit  Medication Sig Dispense  Refill   albuterol (VENTOLIN HFA) 108 (90 Base) MCG/ACT inhaler USE 2 INHALATIONS EVERY 6 HOURS AS NEEDED FOR WHEEZING OR SHORTNESS OF BREATH 8.5 g 0   EPINEPHrine 0.3 mg/0.3 mL IJ SOAJ injection Inject 0.3 mg into the muscle as needed for anaphylaxis. 1 each 0   QNASL 80 MCG/ACT AERS USE 1 SPRAY NASALLY DAILY AS NEEDED 31.8 g 3   traZODone (DESYREL) 50 MG tablet Take 2 tablets (100 mg total) by mouth at bedtime. For sleep 180 tablet 3   No current facility-administered medications on file prior to visit.    BP 126/72   Pulse 65   Temp 97.8 F (36.6 C) (Temporal)   Ht 5\' 4"  (1.626 m)   Wt 123 lb (55.8 kg)   SpO2 98%   BMI 21.11 kg/m  Objective:   Physical  Exam Constitutional:      Appearance: She is not ill-appearing.  HENT:     Right Ear: Tympanic membrane and ear canal normal.     Left Ear: Tympanic membrane and ear canal normal.     Nose: No mucosal edema.     Right Sinus: Maxillary sinus tenderness present. No frontal sinus tenderness.     Left Sinus: Maxillary sinus tenderness present. No frontal sinus tenderness.     Mouth/Throat:     Mouth: Mucous membranes are moist.  Eyes:     Conjunctiva/sclera: Conjunctivae normal.  Cardiovascular:     Rate and Rhythm: Normal rate and regular rhythm.  Pulmonary:     Effort: Pulmonary effort is normal.     Breath sounds: Normal breath sounds. No wheezing or rhonchi.  Musculoskeletal:     Cervical back: Neck supple.  Skin:    General: Skin is warm and dry.           Assessment & Plan:  Acute non-recurrent maxillary sinusitis Assessment & Plan: Not quite convinced that her symptoms are bacterial, but given her recent fever we will treat.  Start Azithromycin antibiotics for infection. Take 2 tablets by mouth today, then 1 tablet daily for 4 additional days.  Start prednisone 20 mg tablets. Take 2 tablets by mouth once daily in the morning for 5 days.  We discussed to use Sudafed sparingly.  Orders: -     predniSONE; Take 2 tablets by mouth once daily in the morning for 5 days.  Dispense: 10 tablet; Refill: 0 -     Azithromycin; Take 2 tablets by mouth today, then 1 tablet daily for 4 additional days.  Dispense: 6 tablet; Refill: 0        Doreene Nest, NP

## 2023-06-04 IMAGING — MG MM DIGITAL DIAGNOSTIC UNILAT*L* W/ TOMO W/ CAD
4 series · 4 of 12 positions shown · non-contrast
Comparison: Previous exam(s).

CLINICAL DATA: Patient recalled from screening for possible left
breast distortion.

EXAM:
DIGITAL DIAGNOSTIC UNILATERAL LEFT MAMMOGRAM WITH TOMOSYNTHESIS AND
CAD
TECHNIQUE: Left digital diagnostic mammography and breast tomosynthesis was
performed. The images were evaluated with computer-aided detection.

[L CC synth-2D]
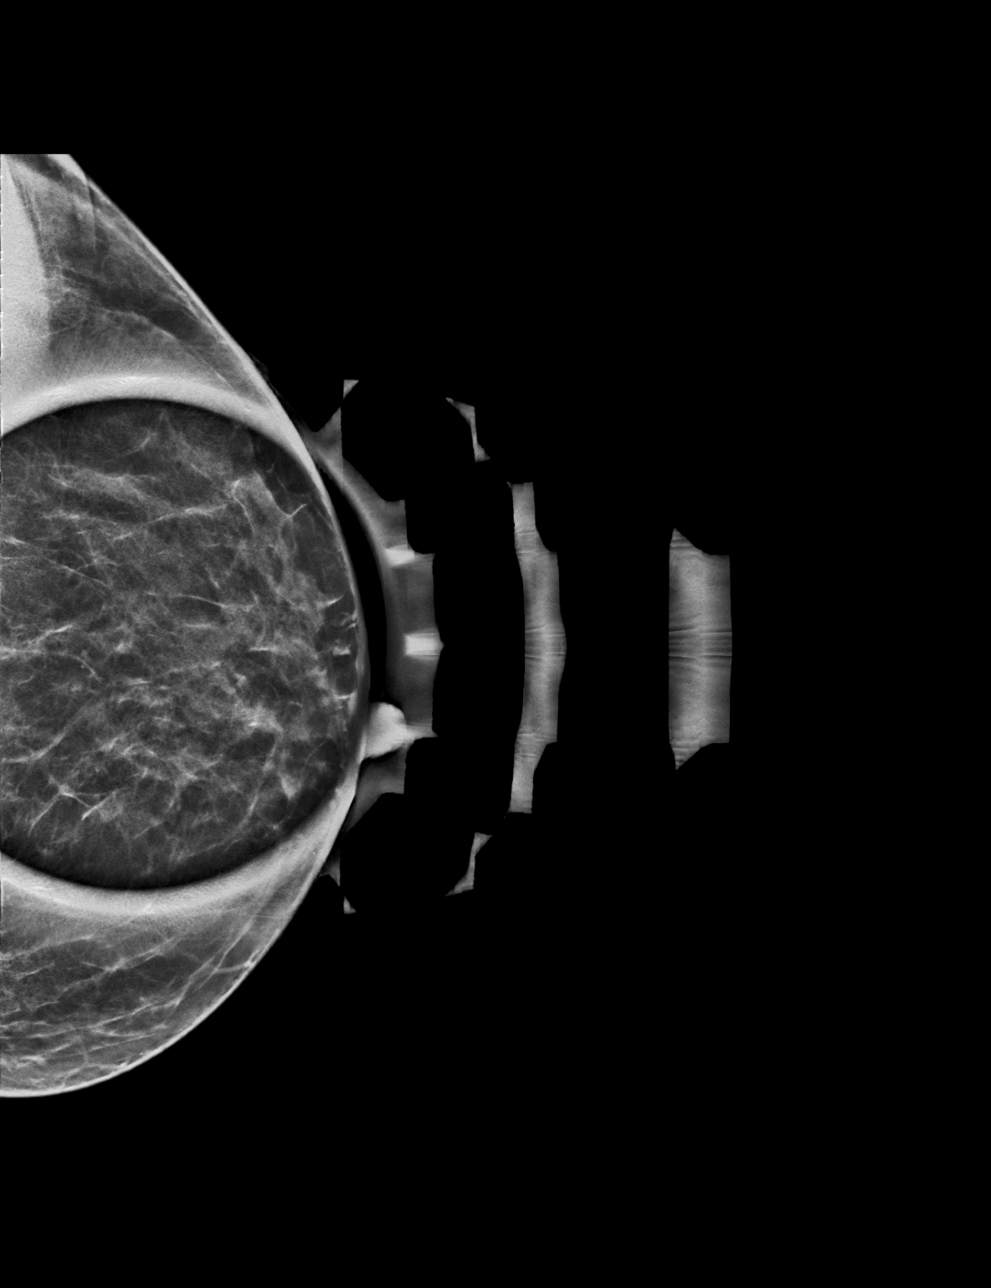

[L ML synth-2D]
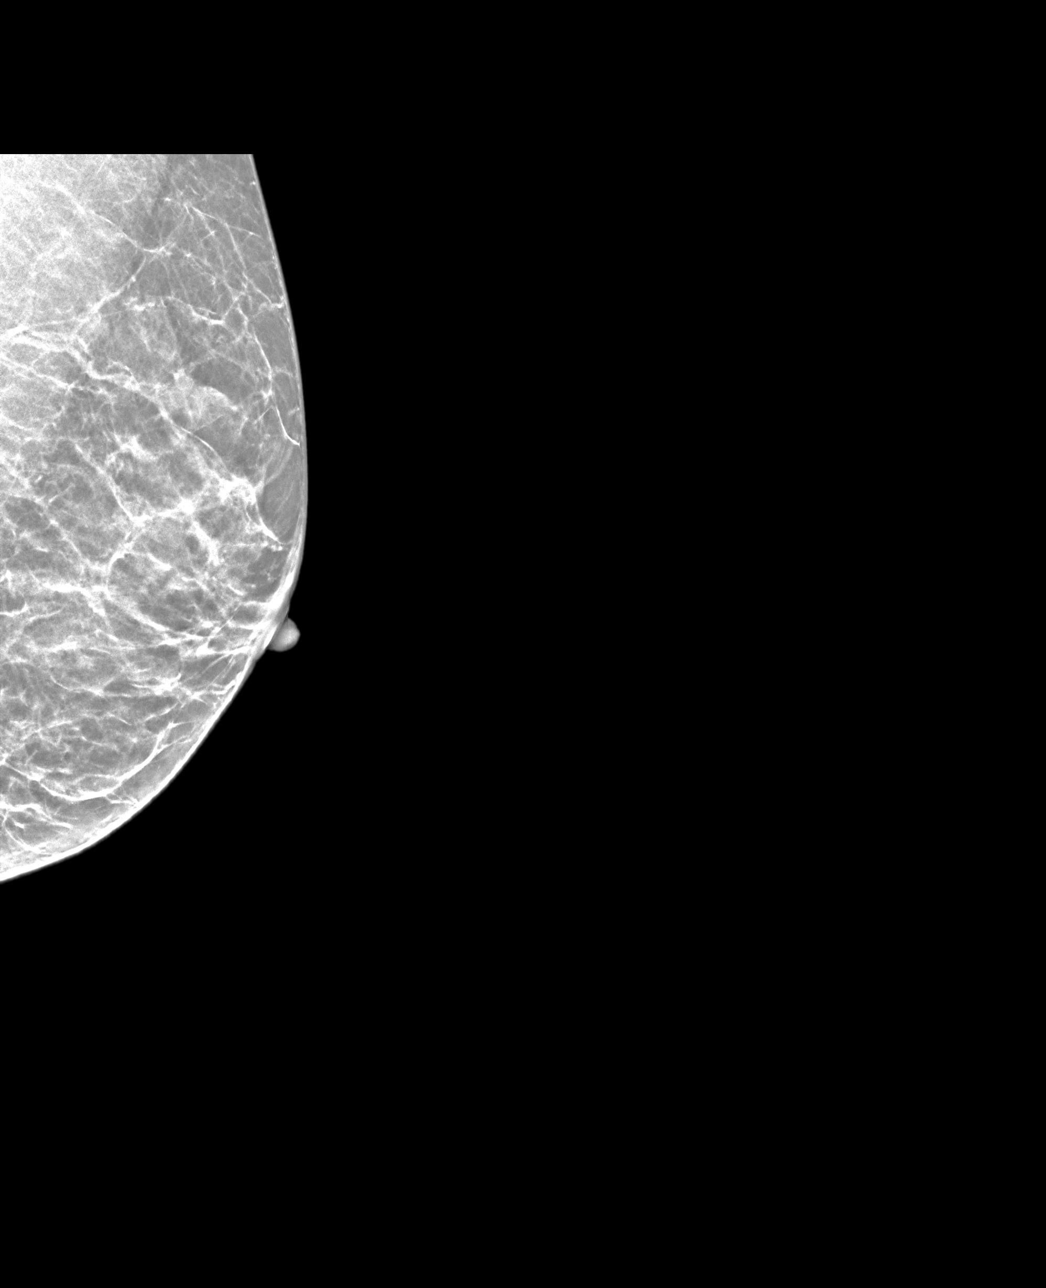

[L CC tomo · tomo slice 21/41.0]
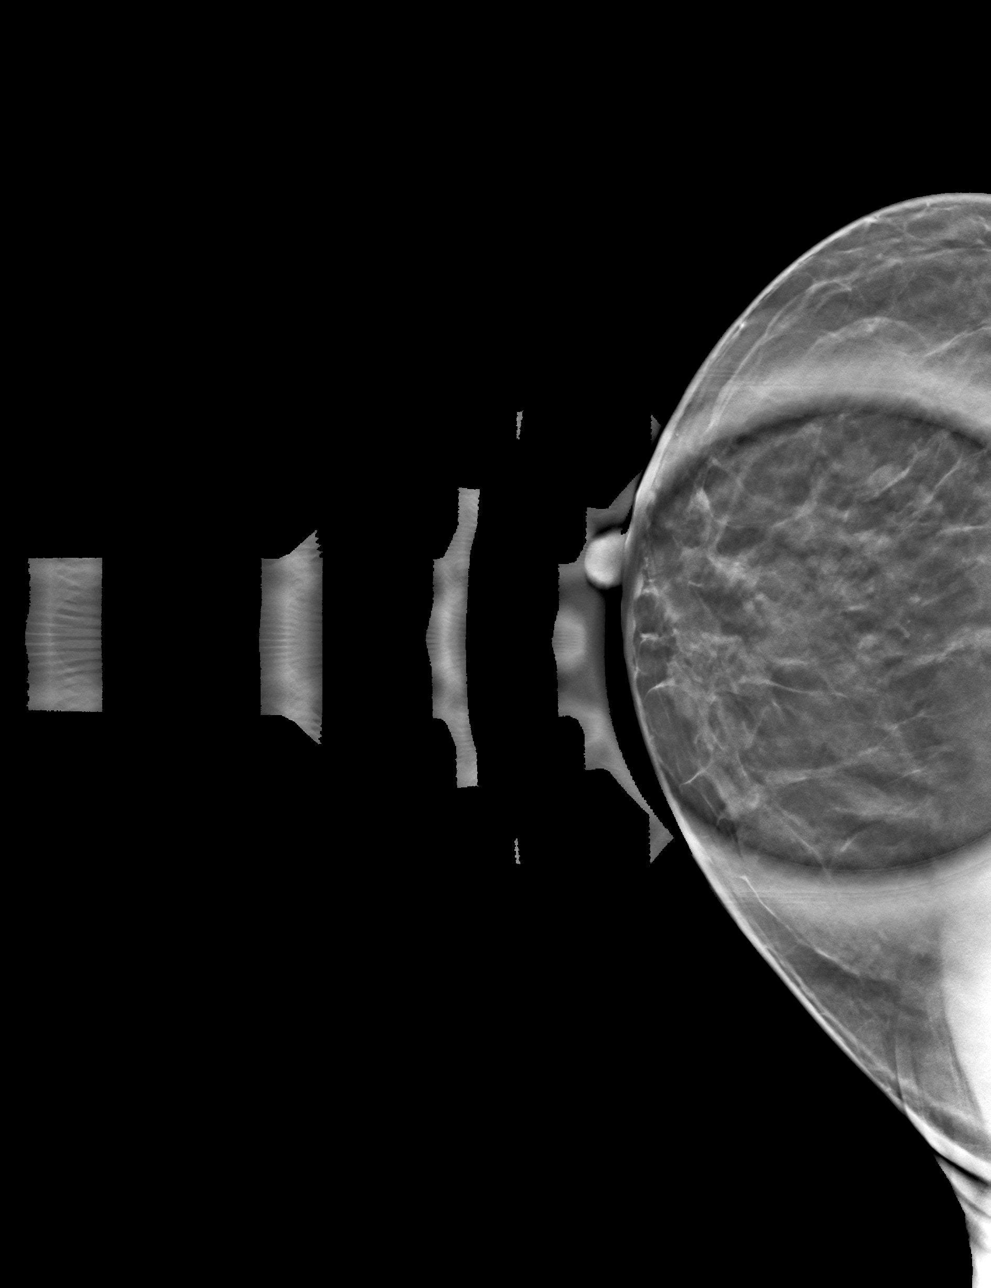

[L ML tomo · tomo slice 21/40.0]
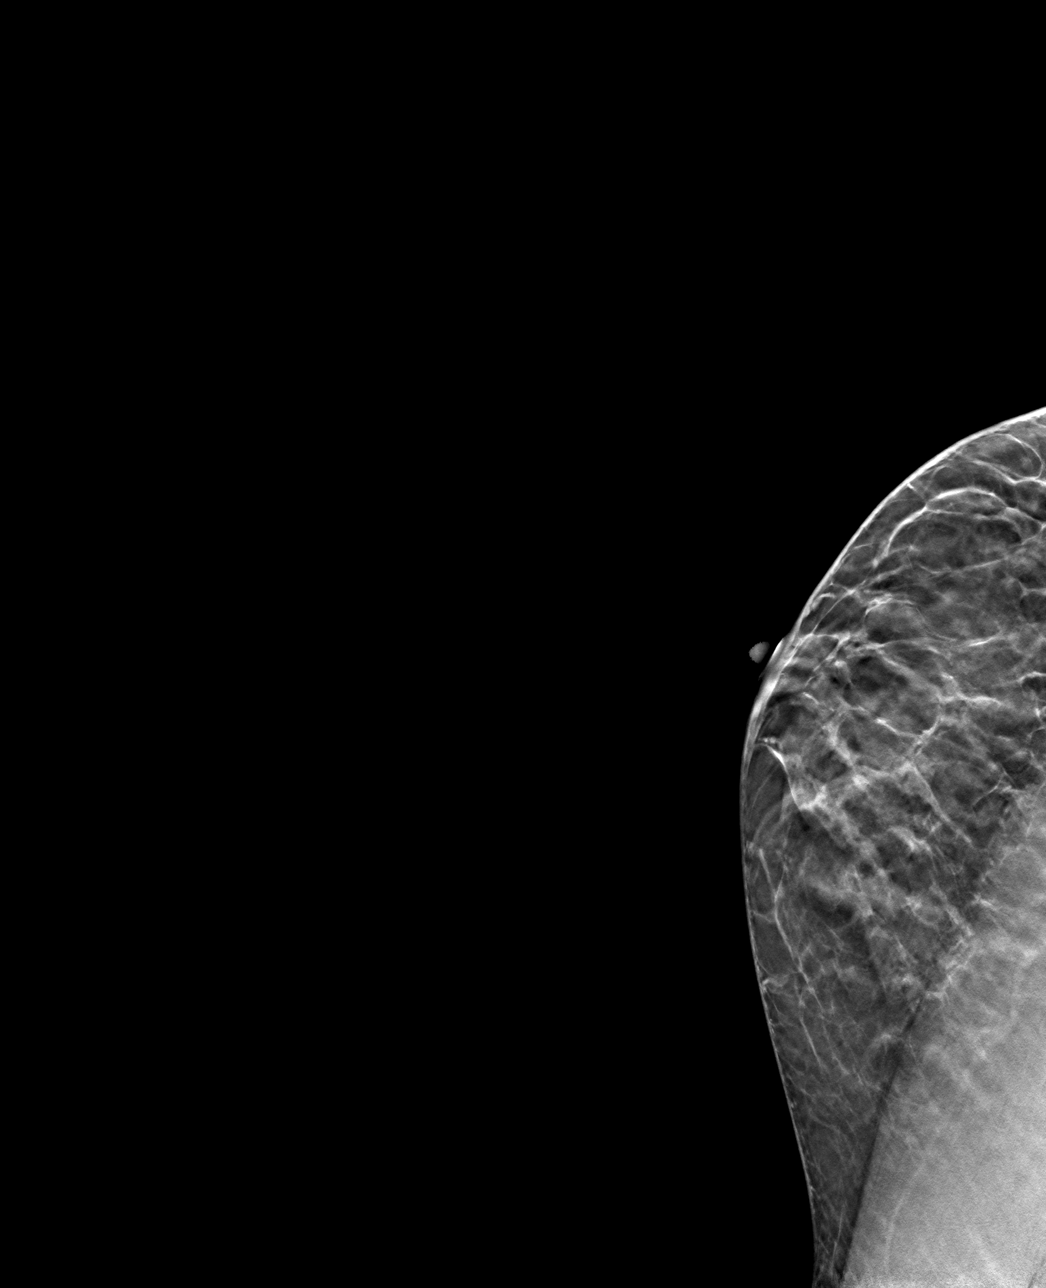

[4 of 12 positions shown; findings below may reference images not displayed]

ACR Breast Density Category c: The breast tissue is heterogeneously
dense, which may obscure small masses.
FINDINGS: Questioned distortion within the outer left breast resolved with
additional imaging compatible with dense overlapping fibroglandular
tissue.
IMPRESSION: No mammographic evidence for malignancy.

RECOMMENDATION:
Screening mammogram in one year.(Code:[id number]-H-[id number])

I have discussed the findings and recommendations with the patient.
If applicable, a reminder letter will be sent to the patient
regarding the next appointment.

BI-RADS CATEGORY  1: Negative.

## 2023-08-11 ENCOUNTER — Other Ambulatory Visit: Payer: Self-pay | Admitting: Primary Care

## 2023-08-11 DIAGNOSIS — J0100 Acute maxillary sinusitis, unspecified: Secondary | ICD-10-CM

## 2023-08-11 NOTE — Telephone Encounter (Signed)
 Copied from CRM [id number]. Topic: Clinical - Medication Refill >> Aug 11, 2023 11:19 AM [place] J wrote: Most Recent Primary Care Visit:  Provider: Doreene Nest  Department: [facility]  Visit Type: ACUTE  Date: 05/19/2023  Medication: predniSONE (DELTASONE) 20 MG tablet  Has the patient contacted their pharmacy? No (Agent: If no, request that the patient contact the pharmacy for the refill. If patient does not wish to contact the pharmacy document the reason why and proceed with request.) (Agent: If yes, when and what did the pharmacy advise?) Patient needs medication to help with allergies, it's been a while since it was last filled.   Is this the correct pharmacy for this prescription? Yes If no, delete pharmacy and type the correct one.  This is the patient's preferred pharmacy:   [id number] [place], [place] - [address] AT [facility] [address] [place] [address] Phone: [phone] Fax: [phone]  Has the prescription been filled recently? No  Is the patient out of the medication? Yes  Has the patient been seen for an appointment in the last year OR does the patient have an upcoming appointment? Yes  Can we respond through MyChart? Yes  Agent: Please be advised that Rx refills may take up to 3 business days. We ask that you follow-up with your pharmacy.

## 2023-08-11 NOTE — Telephone Encounter (Signed)
 Refill inappropriate

## 2023-08-23 ENCOUNTER — Ambulatory Visit: Admitting: Nurse Practitioner

## 2023-08-23 ENCOUNTER — Ambulatory Visit (INDEPENDENT_AMBULATORY_CARE_PROVIDER_SITE_OTHER)
Admission: RE | Admit: 2023-08-23 | Discharge: 2023-08-23 | Disposition: A | Discharge status: Home / Self Care | Source: Ambulatory Visit | Attending: Nurse Practitioner | Admitting: Nurse Practitioner

## 2023-08-23 VITALS — BP 108/72 | HR 65 | Temp 97.8°F | Ht 64.0 in | Wt 119.8 lb

## 2023-08-23 DIAGNOSIS — Z2089 Contact with and (suspected) exposure to other communicable diseases: Secondary | ICD-10-CM

## 2023-08-23 DIAGNOSIS — J3489 Other specified disorders of nose and nasal sinuses: Secondary | ICD-10-CM | POA: Diagnosis not present

## 2023-08-23 DIAGNOSIS — J069 Acute upper respiratory infection, unspecified: Secondary | ICD-10-CM | POA: Diagnosis not present

## 2023-08-23 DIAGNOSIS — R051 Acute cough: Secondary | ICD-10-CM

## 2023-08-23 DIAGNOSIS — J029 Acute pharyngitis, unspecified: Secondary | ICD-10-CM | POA: Insufficient documentation

## 2023-08-23 MED ORDER — PREDNISONE 20 MG PO TABS: ORAL_TABLET | ORAL | 0 refills | Status: DC

## 2023-08-23 MED ORDER — LEVOCETIRIZINE DIHYDROCHLORIDE 5 MG PO TABS: 5.0000 mg | ORAL_TABLET | Freq: Every evening | ORAL | 0 refills | Status: AC

## 2023-08-23 NOTE — Assessment & Plan Note (Signed)
 Flu, COVID, strep test negative in office.  Do think his abdominal nature.  Symptomatic treatment patient will follow-up if she does not improve

## 2023-08-23 NOTE — Assessment & Plan Note (Signed)
 Will do prednisone  40 mg every morning for 5 days.  Avoid NSAIDs take with food continue Qnasl 

## 2023-08-23 NOTE — Assessment & Plan Note (Signed)
 Husband recently diagnosed with pneumonia.  Pending chest x-ray today

## 2023-08-23 NOTE — Patient Instructions (Signed)
 Nice to see you today I will be in touch with the offical xray results once I have them Drink plenty of fluid. Continue using the Qnasl .  I have sent in an antihistamine to try also

## 2023-08-23 NOTE — Progress Notes (Signed)
 Acute Office Visit  Subjective:     Patient ID: Kelli Snyder, female    DOB: 1960/08/03, 63 y.o.   MRN: [id number]  Chief Complaint  Patient presents with   Nasal Congestion    Pt complains of cough and congestion with a slight sore throat. that started 3 weeks ago. Pt mentions headaches that have worsened. Pt states of chest irritation especially when coughing. Phlegm started off greenish but is now clear today. Pt took COVID test taken 3 weeks ago was negative. Pt has taken tylenol this morning.     HPI Patient is in today for sick symptoms with a history of asthma, recurrent sinusitis, prediabetes, HLD.  Patient does not have a history of smoking  Of note patient was evaluated on 05/19/2023 by her primary care provider.  She was written azithromycin  and prednisone  at that juncture.  Before that she was evaluated by a colleague on 04/04/2019 for and was treated with Augmentin  twice daily for 7 days  Covid vaccine: original plus a booster  Flu vaccine: 02/14/2023  States that it started 3 weeks ago with congestion drainage and cough. States that she took a covid test and it was negative. States last week she started feeling better. States that she started feeling worse on Friday and Sunday was the worse States that her husband had the same symptoms and was dx with pna. States was evaluated for pna and placed on abx.  States she has used Advil. States that she has had to use the albuterol  inhaler with her exercise that is not normal. She also did pseudoephedrine that did help but caused other symptoms    Review of Systems  Constitutional:  Positive for malaise/fatigue. Negative for chills and fever.  HENT:  Positive for ear pain, sinus pain and sore throat. Negative for ear discharge.   Respiratory:  Positive for cough, sputum production (clear to light color) and shortness of breath.   Gastrointestinal:  Negative for abdominal pain, diarrhea, nausea and vomiting.  Musculoskeletal:   Negative for myalgias.  Neurological:  Positive for headaches.        Objective:    BP 108/72   Pulse 65   Temp 97.8 F (36.6 C) (Oral)   Ht 5\' 4"  (1.626 m)   Wt 119 lb 12.8 oz (54.3 kg)   SpO2 98%   BMI 20.56 kg/m    Physical Exam Vitals and nursing note reviewed.  Constitutional:      Appearance: Normal appearance.  HENT:     Right Ear: Tympanic membrane, ear canal and external ear normal.     Left Ear: Tympanic membrane, ear canal and external ear normal.     Nose:     Right Sinus: No maxillary sinus tenderness or frontal sinus tenderness.     Left Sinus: Frontal sinus tenderness present. No maxillary sinus tenderness.     Mouth/Throat:     Mouth: Mucous membranes are moist.     Pharynx: No posterior oropharyngeal erythema.  Cardiovascular:     Rate and Rhythm: Normal rate and regular rhythm.     Heart sounds: Normal heart sounds.  Pulmonary:     Effort: Pulmonary effort is normal.     Breath sounds: Normal breath sounds.  Lymphadenopathy:     Cervical: Cervical adenopathy present.  Neurological:     Mental Status: She is alert.     No results found for any visits on 08/23/23.      Assessment & Plan:   Problem List  Items Addressed This Visit       Respiratory   Upper respiratory tract infection   Flu, COVID, strep test negative in office.  Do think his abdominal nature.  Symptomatic treatment patient will follow-up if she does not improve      Relevant Medications   levocetirizine (XYZAL ) 5 MG tablet     Other   Exposure to pneumonia - Primary   Husband recently diagnosed with pneumonia.  Pending chest x-ray today      Relevant Orders   DG Chest 2 View (Completed)   Acute cough   Antihistamine and nasal spray as prescribed.  Drink plenty of fluids flu, COVID test in office      Relevant Medications   levocetirizine (XYZAL ) 5 MG tablet   Other Relevant Orders   DG Chest 2 View (Completed)   Sinus pressure   Will do prednisone  40 mg every  morning for 5 days.  Avoid NSAIDs take with food continue Qnasl       Relevant Medications   predniSONE  (DELTASONE ) 20 MG tablet   levocetirizine (XYZAL ) 5 MG tablet   Sore throat   Flu, COVID, strep test in office.  Patient can using intranasal steroid drinking plenty of fluid and over-the-counter analgesics as needed.       Meds ordered this encounter  Medications   predniSONE  (DELTASONE ) 20 MG tablet    Sig: Take 2 tablets by mouth once daily in the morning for 5 days.    Dispense:  10 tablet    Refill:  0    Supervising Provider:   Deri Fleet A [[id number]]   levocetirizine (XYZAL ) 5 MG tablet    Sig: Take 1 tablet (5 mg total) by mouth every evening.    Dispense:  30 tablet    Refill:  0    Supervising Provider:   Deri Fleet A [[id number]]    Return if symptoms worsen or fail to improve.  Margarie Shay, NP

## 2023-08-23 NOTE — Assessment & Plan Note (Signed)
 Antihistamine and nasal spray as prescribed.  Drink plenty of fluids flu, COVID test in office

## 2023-08-23 NOTE — Assessment & Plan Note (Signed)
 Flu, COVID, strep test in office.  Patient can using intranasal steroid drinking plenty of fluid and over-the-counter analgesics as needed.

## 2023-08-24 ENCOUNTER — Encounter: Payer: Self-pay | Admitting: Nurse Practitioner

## 2023-08-28 ENCOUNTER — Telehealth: Payer: Self-pay

## 2023-08-28 MED ORDER — DOXYCYCLINE HYCLATE 100 MG PO TABS: 100.0000 mg | ORAL_TABLET | Freq: Two times a day (BID) | ORAL | 0 refills | Status: AC

## 2023-08-28 NOTE — Telephone Encounter (Signed)
 Copied from CRM [id number]. Topic: General - Other >> Aug 28, 2023  8:02 AM Emylou G wrote: Reason for CRM: Message to NP [employer].. patient adv thinks she has sinus issues.. had seen you last week.. wants to know if can get antiobotics?  Pls call her number on file is good.. (Slight headache and congestion ) Fever over the weekend   Last evaluated in our office by Margarie Shay, NP

## 2023-08-28 NOTE — Addendum Note (Signed)
 Addended by: Dorothe Gaster on: 08/28/2023 01:40 PM   Modules accepted: Orders

## 2023-08-28 NOTE — Telephone Encounter (Signed)
Doxycycline antibiotics sent to pharmacy

## 2023-08-28 NOTE — Telephone Encounter (Signed)
 Left detailed voicemail for patient to call the office back if there are any questions or concerns regarding abx.

## 2023-08-31 ENCOUNTER — Ambulatory Visit: Payer: Self-pay

## 2023-08-31 NOTE — Telephone Encounter (Signed)
 This RN attempted to return call to patient. No answer. LVM.

## 2023-08-31 NOTE — Telephone Encounter (Signed)
 This Rn attempted to contact patient. No answer. Due to end of day, this RN forwarding to office for further assistance. Patient has concerns of abdominal pain with medication.

## 2023-09-01 MED ORDER — AMOXICILLIN-POT CLAVULANATE 875-125 MG PO TABS: 1.0000 | ORAL_TABLET | Freq: Two times a day (BID) | ORAL | 0 refills | Status: AC

## 2023-09-01 MED ORDER — AMOXICILLIN-POT CLAVULANATE 875-125 MG PO TABS: 1.0000 | ORAL_TABLET | Freq: Two times a day (BID) | ORAL | 0 refills | Status: DC

## 2023-09-01 NOTE — Telephone Encounter (Signed)
 Augmentin  called into pharmacy. If no improvement she will need an office visit

## 2023-09-01 NOTE — Telephone Encounter (Signed)
 Pt was seen on 08/23/23.pt started taking doxycycline  on 08/29/23 bid with food; pt took 5 doses of med before stopping doxycycline  on 08/31/23.due to having upper abd cramping after taking abx. No diarrhea and no N&V. Pt said coivd and flu test were neg. Pt thinks has sinus infection pt had fever on 08/26/23 100.3. no fever in last 3 days. Now dry cough is better but when pt blows her nose has green mucus. Pt has head congestion and H/A. Pt request different abx to [facility]. Pt request cb after Jolan Natal reviews note and can leave detailed v/m if no answer. Sending note to Margarie Shay NP.

## 2023-09-01 NOTE — Telephone Encounter (Signed)
 Unable to reach pt by any contact # and left v/m requesting pt to call [phone] for triage. Sending note to [facility] triage and [facility].

## 2023-09-01 NOTE — Telephone Encounter (Signed)
 Left VM letting pt know Rx sent and advised of Matt's comments

## 2023-09-01 NOTE — Addendum Note (Signed)
 Addended by: Dorothe Gaster on: 09/01/2023 12:34 PM   Modules accepted: Orders

## 2023-09-19 ENCOUNTER — Encounter (INDEPENDENT_AMBULATORY_CARE_PROVIDER_SITE_OTHER): Payer: Self-pay

## 2023-10-19 ENCOUNTER — Other Ambulatory Visit: Payer: Self-pay | Admitting: Primary Care

## 2023-10-19 DIAGNOSIS — J302 Other seasonal allergic rhinitis: Secondary | ICD-10-CM

## 2023-10-19 NOTE — Telephone Encounter (Signed)
 lvm for pt to call office to schedule appt.

## 2023-10-19 NOTE — Telephone Encounter (Signed)
 Patient is due for CPE/follow up, this will be required prior to any further refills.  Please schedule, thank you!

## 2023-10-20 NOTE — Telephone Encounter (Signed)
 lvm for pt to call office to schedule appt.

## 2023-10-23 NOTE — Telephone Encounter (Signed)
 LVM to be schedule Patient is due for CPE/follow up, this will be required prior to any further refills.  Please schedule, thank you!

## 2023-10-25 NOTE — Telephone Encounter (Signed)
 Sent Mychart

## 2023-11-16 ENCOUNTER — Encounter: Payer: Self-pay | Admitting: Primary Care

## 2023-11-16 ENCOUNTER — Ambulatory Visit (INDEPENDENT_AMBULATORY_CARE_PROVIDER_SITE_OTHER): Admitting: Primary Care

## 2023-11-16 VITALS — BP 116/70 | HR 50 | Temp 97.6°F | Ht 64.25 in | Wt 117.1 lb

## 2023-11-16 DIAGNOSIS — T63461S Toxic effect of venom of wasps, accidental (unintentional), sequela: Secondary | ICD-10-CM | POA: Diagnosis not present

## 2023-11-16 DIAGNOSIS — Z1231 Encounter for screening mammogram for malignant neoplasm of breast: Secondary | ICD-10-CM

## 2023-11-16 DIAGNOSIS — J302 Other seasonal allergic rhinitis: Secondary | ICD-10-CM

## 2023-11-16 DIAGNOSIS — G4700 Insomnia, unspecified: Secondary | ICD-10-CM

## 2023-11-16 DIAGNOSIS — Z0000 Encounter for general adult medical examination without abnormal findings: Secondary | ICD-10-CM | POA: Diagnosis not present

## 2023-11-16 DIAGNOSIS — R7303 Prediabetes: Secondary | ICD-10-CM

## 2023-11-16 DIAGNOSIS — E785 Hyperlipidemia, unspecified: Secondary | ICD-10-CM | POA: Diagnosis not present

## 2023-11-16 DIAGNOSIS — J4520 Mild intermittent asthma, uncomplicated: Secondary | ICD-10-CM | POA: Diagnosis not present

## 2023-11-16 DIAGNOSIS — T782XXS Anaphylactic shock, unspecified, sequela: Secondary | ICD-10-CM

## 2023-11-16 MED ORDER — EPINEPHRINE 0.3 MG/0.3ML IJ SOAJ: 0.3000 mg | INTRAMUSCULAR | 0 refills | Status: AC | PRN

## 2023-11-16 MED ORDER — ALBUTEROL SULFATE HFA 108 (90 BASE) MCG/ACT IN AERS: 2.0000 | INHALATION_SPRAY | Freq: Four times a day (QID) | RESPIRATORY_TRACT | 0 refills | Status: DC | PRN

## 2023-11-16 NOTE — Assessment & Plan Note (Signed)
 Immunizations UTD.  Tetanus due in 2026 Pap smear UTD. Mammogram due in October 2025, orders placed. Colonoscopy UTD, due 2032  Discussed the importance of a healthy diet and regular exercise in order for weight loss, and to reduce the risk of further co-morbidity.  Exam stable. Labs pending.  Follow up in 1 year for repeat physical.

## 2023-11-16 NOTE — Assessment & Plan Note (Signed)
 Repeat A1c pending.  Continue regular exercise and healthy diet.

## 2023-11-16 NOTE — Patient Instructions (Signed)
 Stop by the lab prior to leaving today. I will notify you of your results once received.   [facility].   It was a pleasure to see you today!

## 2023-11-16 NOTE — Progress Notes (Signed)
 Subjective:    Patient ID: Kelli Snyder, female    DOB: 08-25-1960, 63 y.o.   MRN: [id number]  HPI  Kelli Snyder is a very pleasant 63 y.o. female who presents today for complete physical and follow up of chronic conditions.   Immunizations: -Tetanus: Completed in 2016  -Shingles: Completed Shingrix  series -Pneumonia: Completed in 2019  Diet: Fair diet.  Exercise: Regular exercise.  Eye exam: Completes annually  Dental exam: Completes semi-annually    Pap Smear: Completed in June 2023 Mammogram: Completed in October 2024  Colonoscopy: Completed in 2022, due 2032   BP Readings from Last 3 Encounters:  11/16/23 116/70  08/23/23 108/72  05/19/23 126/72      Review of Systems  Constitutional:  Negative for unexpected weight change.  HENT:  Negative for rhinorrhea.   Respiratory:  Negative for cough and shortness of breath.   Cardiovascular:  Negative for chest pain.  Gastrointestinal:  Negative for constipation and diarrhea.  Genitourinary:  Negative for difficulty urinating and menstrual problem.  Musculoskeletal:  Negative for arthralgias and myalgias.  Skin:  Negative for rash.  Allergic/Immunologic: Negative for environmental allergies.  Neurological:  Negative for dizziness, numbness and headaches.  Psychiatric/Behavioral:  The patient is not nervous/anxious.          Past Medical History:  Diagnosis Date   Allergy 2003   Chronic seasonal allergic rhinitis    Chronic sinusitis    Dysplastic nevus 11/12/2014   R mid helix - moderate   Dysplastic nevus 10/24/2019   R low back sacral paraspinal    Exercise-induced asthma    Migraines     Social History   Socioeconomic History   Marital status: Married    Spouse name: Not on file   Number of children: Not on file   Years of education: Not on file   Highest education level: Not on file  Occupational History   Not on file  Tobacco Use   Smoking status: Never   Smokeless tobacco: Never   Vaping Use   Vaping status: Never Used  Substance and Sexual Activity   Alcohol use: Yes   Drug use: Never   Sexual activity: Yes  Other Topics Concern   Not on file  Social History Glass blower/designer for [employer].   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   Married.   2 children.   Social Drivers of Corporate investment banker Strain: Not on file  Food Insecurity: Not on file  Transportation Needs: Not on file  Physical Activity: Not on file  Stress: Not on file  Social Connections: Not on file  Intimate Partner Violence: Not on file    Past Surgical History:  Procedure Laterality Date   COLONOSCOPY WITH PROPOFOL  N/A 12/29/2020   Procedure: COLONOSCOPY WITH PROPOFOL ;  Surgeon: Jinny Carmine, MD;  Location: [facility] ENDOSCOPY;  Service: Endoscopy;  Laterality: N/A;   GUM SURGERY  1991   TUBAL LIGATION  1997    Family History  Problem Relation Age of Onset   Breast cancer Mother 50       On HRT prior to diagnosis    Hyperlipidemia Mother    Melanoma Mother    Hypertension Father    Hypertension Maternal Grandmother    Stroke Maternal Grandmother 20   Hypertension Paternal Grandmother     Allergies  Allergen Reactions   Sulfa Antibiotics Swelling    Swelling and itching of lips, denies breathing problems Swelling and itching of lips, denies breathing problems  Current Outpatient Medications on File Prior to Visit  Medication Sig Dispense Refill   traZODone  (DESYREL ) 50 MG tablet Take 2 tablets (100 mg total) by mouth at bedtime. For sleep (Patient taking differently: Take 50 mg by mouth at bedtime. For sleep) 180 tablet 3   levocetirizine (XYZAL ) 5 MG tablet Take 1 tablet (5 mg total) by mouth every evening. 30 tablet 0   QNASL  80 MCG/ACT AERS USE 1 SPRAY NASALLY DAILY AS NEEDED 31.8 g 0   No current facility-administered medications on file prior to visit.    BP 116/70 (BP Location: Left Arm, Patient Position: Sitting, Cuff Size: Normal)   Pulse (!) 50   Temp 97.6  F (36.4 C) (Oral)   Ht 5' 4.25 (1.632 m)   Wt 117 lb 2 oz (53.1 kg)   SpO2 100%   BMI 19.95 kg/m  Objective:   Physical Exam HENT:     Right Ear: Tympanic membrane and ear canal normal.     Left Ear: Tympanic membrane and ear canal normal.  Eyes:     Pupils: Pupils are equal, round, and reactive to light.  Cardiovascular:     Rate and Rhythm: Normal rate and regular rhythm.  Pulmonary:     Effort: Pulmonary effort is normal.     Breath sounds: Normal breath sounds.  Abdominal:     General: Bowel sounds are normal.     Palpations: Abdomen is soft.     Tenderness: There is no abdominal tenderness.  Musculoskeletal:        General: Normal range of motion.     Cervical back: Neck supple.  Skin:    General: Skin is warm and dry.  Neurological:     Mental Status: She is alert and oriented to person, place, and time.     Cranial Nerves: No cranial nerve deficit.     Deep Tendon Reflexes:     Reflex Scores:      Patellar reflexes are 2+ on the right side and 2+ on the left side. Psychiatric:        Mood and Affect: Mood normal.           Assessment & Plan:  Preventative health care Assessment & Plan: Immunizations UTD.  Tetanus due in 2026 Pap smear UTD. Mammogram due in October 2025, orders placed. Colonoscopy UTD, due 2032  Discussed the importance of a healthy diet and regular exercise in order for weight loss, and to reduce the risk of further co-morbidity.  Exam stable. Labs pending.  Follow up in 1 year for repeat physical.    Anaphylactic reaction to wasp sting, accidental or unintentional, sequela -     EPINEPHrine ; Inject 0.3 mg into the muscle as needed for anaphylaxis.  Dispense: 1 each; Refill: 0  Mild intermittent asthma without complication Assessment & Plan: Controlled.  Continue albuterol  inhaler PRN. Refill provided.   Orders: -     Albuterol  Sulfate HFA; Inhale 2 puffs into the lungs every 6 (six) hours as needed for wheezing or  shortness of breath.  Dispense: 8.5 g; Refill: 0  Seasonal allergies Assessment & Plan: Controlled.  Continue Qnasal 80 mcg daily. Continue Xyzal  5 mg daily. Refill provided for EpiPen .   Prediabetes Assessment & Plan: Repeat A1c pending.  Continue regular exercise and healthy diet.  Orders: -     Hemoglobin A1c  Insomnia, unspecified type Assessment & Plan: Controlled.  Continue trazodone  50 to 100 mg at bedtime    Hyperlipidemia, unspecified hyperlipidemia type  Assessment & Plan: Likely hereditary.  Repeat lipid panel pending.  Given family history, coupled with hyperlipidemia, will obtain CT coronary calcium score.  She agrees.  Orders placed.  Orders: -     CT CARDIAC SCORING (SELF PAY ONLY); Future -     Lipid panel -     Comprehensive metabolic panel with GFR  Screening mammogram for breast cancer -     3D Screening Mammogram, Left and Right; Future        Comer MARLA Gaskins, NP

## 2023-11-16 NOTE — Assessment & Plan Note (Signed)
 Controlled.  Continue trazodone  50 to 100 mg at bedtime

## 2023-11-16 NOTE — Assessment & Plan Note (Signed)
 Likely hereditary.  Repeat lipid panel pending.  Given family history, coupled with hyperlipidemia, will obtain CT coronary calcium score.  She agrees.  Orders placed.

## 2023-11-16 NOTE — Assessment & Plan Note (Signed)
 Controlled.  Continue albuterol  inhaler PRN. Refill provided.

## 2023-11-16 NOTE — Assessment & Plan Note (Addendum)
 Controlled.  Continue Qnasal 80 mcg daily. Continue Xyzal  5 mg daily. Refill provided for EpiPen .

## 2023-11-17 ENCOUNTER — Ambulatory Visit: Payer: Self-pay | Admitting: Primary Care

## 2023-11-17 LAB — COMPREHENSIVE METABOLIC PANEL WITH GFR
ALT: 18 U/L (ref 0–35)
AST: 29 U/L (ref 0–37)
Albumin: 4.5 g/dL (ref 3.5–5.2)
Alkaline Phosphatase: 58 U/L (ref 39–117)
BUN: 23 mg/dL (ref 6–23)
CO2: 28 meq/L (ref 19–32)
Calcium: 9.5 mg/dL (ref 8.4–10.5)
Chloride: 102 meq/L (ref 96–112)
Creatinine, Ser: 1.09 mg/dL (ref 0.40–1.20)
GFR: 55.16 mL/min — ABNORMAL LOW (ref >60.00)
Glucose, Bld: 92 mg/dL (ref 70–99)
Potassium: 4.2 meq/L (ref 3.5–5.1)
Sodium: 138 meq/L (ref 135–145)
Total Bilirubin: 0.5 mg/dL (ref 0.2–1.2)
Total Protein: 6.4 g/dL (ref 6.0–8.3)

## 2023-11-17 LAB — LIPID PANEL
Cholesterol: 277 mg/dL — ABNORMAL HIGH (ref 0–200)
HDL: 101.8 mg/dL (ref >39.00)
LDL Cholesterol: 158 mg/dL — ABNORMAL HIGH (ref 0–99)
NonHDL: 175.49
Total CHOL/HDL Ratio: 3
Triglycerides: 88 mg/dL (ref 0.0–149.0)
VLDL: 17.6 mg/dL (ref 0.0–40.0)

## 2023-11-17 LAB — HEMOGLOBIN A1C: Hgb A1c MFr Bld: 6.2 % (ref 4.6–6.5)

## 2023-11-21 ENCOUNTER — Other Ambulatory Visit: Payer: Self-pay

## 2023-11-21 DIAGNOSIS — G4700 Insomnia, unspecified: Secondary | ICD-10-CM

## 2023-11-21 MED ORDER — TRAZODONE HCL 50 MG PO TABS: 50.0000 mg | ORAL_TABLET | Freq: Every day | ORAL | 3 refills | Status: AC

## 2024-01-17 ENCOUNTER — Ambulatory Visit: Admitting: Dermatology

## 2024-02-05 ENCOUNTER — Ambulatory Visit: Admitting: Dermatology

## 2024-02-05 ENCOUNTER — Encounter: Payer: Self-pay | Admitting: Dermatology

## 2024-02-05 DIAGNOSIS — Z79899 Other long term (current) drug therapy: Secondary | ICD-10-CM

## 2024-02-05 DIAGNOSIS — D229 Melanocytic nevi, unspecified: Secondary | ICD-10-CM

## 2024-02-05 DIAGNOSIS — D1801 Hemangioma of skin and subcutaneous tissue: Secondary | ICD-10-CM

## 2024-02-05 DIAGNOSIS — Z1283 Encounter for screening for malignant neoplasm of skin: Secondary | ICD-10-CM

## 2024-02-05 DIAGNOSIS — W908XXA Exposure to other nonionizing radiation, initial encounter: Secondary | ICD-10-CM | POA: Diagnosis not present

## 2024-02-05 DIAGNOSIS — L814 Other melanin hyperpigmentation: Secondary | ICD-10-CM | POA: Diagnosis not present

## 2024-02-05 DIAGNOSIS — Z86018 Personal history of other benign neoplasm: Secondary | ICD-10-CM

## 2024-02-05 DIAGNOSIS — L821 Other seborrheic keratosis: Secondary | ICD-10-CM

## 2024-02-05 DIAGNOSIS — L578 Other skin changes due to chronic exposure to nonionizing radiation: Secondary | ICD-10-CM | POA: Diagnosis not present

## 2024-02-05 DIAGNOSIS — Z808 Family history of malignant neoplasm of other organs or systems: Secondary | ICD-10-CM

## 2024-02-05 DIAGNOSIS — L700 Acne vulgaris: Secondary | ICD-10-CM

## 2024-02-05 DIAGNOSIS — Z7189 Other specified counseling: Secondary | ICD-10-CM

## 2024-02-05 MED ORDER — CLINDAMYCIN PHOS (TWICE-DAILY) 1 % EX GEL: CUTANEOUS | 11 refills | Status: AC

## 2024-02-05 NOTE — Progress Notes (Signed)
 Follow-Up Visit   Subjective  Kelli Snyder is a 63 y.o. female who presents for the following: Skin Cancer Screening and Full Body Skin Exam, hx of Dysplastic nevus. Patient mom with a hx of Melanoma.  Patient c/o acne on her face, tried Winlevi  in the past no help, patient would like a rx for Clindagel her daughter uses for acne.   The patient presents for Total-Body Skin Exam (TBSE) for skin cancer screening and mole check. The patient has spots, moles and lesions to be evaluated, some may be new or changing and the patient may have concern these could be cancer.  The following portions of the chart were reviewed this encounter and updated as appropriate: medications, allergies, medical history  Review of Systems:  No other skin or systemic complaints except as noted in HPI or Assessment and Plan.  Objective  Well appearing patient in no apparent distress; mood and affect are within normal limits.  A full examination was performed including scalp, head, eyes, ears, nose, lips, neck, chest, axillae, abdomen, back, buttocks, bilateral upper extremities, bilateral lower extremities, hands, feet, fingers, toes, fingernails, and toenails. All findings within normal limits unless otherwise noted below.   Relevant physical exam findings are noted in the Assessment and Plan.    Assessment & Plan   SKIN CANCER SCREENING PERFORMED TODAY.  ACTINIC DAMAGE - Chronic condition, secondary to cumulative UV/sun exposure - diffuse scaly erythematous macules with underlying dyspigmentation - Recommend daily broad spectrum sunscreen SPF 30+ to sun-exposed areas, reapply every 2 hours as needed.  - Staying in the shade or wearing long sleeves, sun glasses (UVA+UVB protection) and wide brim hats (4-inch brim around the entire circumference of the hat) are also recommended for sun protection.  - Call for new or changing lesions.  LENTIGINES, SEBORRHEIC KERATOSES, HEMANGIOMAS - Benign normal skin  lesions - Benign-appearing - Call for any changes  MELANOCYTIC NEVI - Tan-brown and/or pink-flesh-colored symmetric macules and papules - Benign appearing on exam today - Observation - Call clinic for new or changing moles - Recommend daily use of broad spectrum spf 30+ sunscreen to sun-exposed areas.    ACNE VULGARIS Exam: ~ 7 resolving papules of the mandible  Chronic and persistent condition with duration or expected duration over one year. Condition is symptomatic/ bothersome to patient. Not currently at goal.  Treatment Plan: Patient tried Winlevi  for a brief period just to sample tubes in the past without improvement.  She is advised this is not typically long enough to result with Winlevi . Discussed Spironolactone tablet, patient declines Patient would like to try something topical.  Her daughter does use Clindagel and thinks it works.  She would like to try this.  Start Clindagel apply to affected skin bid   May consider oral Seysara in the future (patient has had GI symptoms with oral doxycycline  in the past) if no help with Clindagel    FAMILY HISTORY OF SKIN CANCER What type(s): Melanoma Who affected: Mother     HISTORY OF DYSPLASTIC NEVUS No evidence of recurrence today Recommend regular full body skin exams Recommend daily broad spectrum sunscreen SPF 30+ to sun-exposed areas, reapply every 2 hours as needed.  Call if any new or changing lesions are noted between office visits    SKIN CANCER SCREENING   HISTORY OF DYSPLASTIC NEVUS   ACTINIC SKIN DAMAGE   LENTIGO   MELANOCYTIC NEVUS, UNSPECIFIED LOCATION   FAMILY HISTORY OF SKIN CANCER   ACNE VULGARIS   COUNSELING AND COORDINATION  OF CARE   MEDICATION MANAGEMENT   Return in about 4 months (around 06/07/2024) for acne and TBSE in 12 months .  IFay Snyder, CMA, am acting as scribe for Kelli Rhyme, MD .   Documentation: I have reviewed the above documentation for accuracy and  completeness, and I agree with the above.  Kelli Rhyme, MD

## 2024-02-05 NOTE — Patient Instructions (Signed)

## 2024-02-07 ENCOUNTER — Ambulatory Visit
Admission: RE | Admit: 2024-02-07 | Discharge: 2024-02-07 | Disposition: A | Discharge status: Home / Self Care | Source: Ambulatory Visit | Attending: Primary Care | Admitting: Primary Care

## 2024-02-07 DIAGNOSIS — Z1231 Encounter for screening mammogram for malignant neoplasm of breast: Secondary | ICD-10-CM | POA: Diagnosis present

## 2024-02-19 ENCOUNTER — Other Ambulatory Visit: Payer: Self-pay | Admitting: Primary Care

## 2024-02-19 ENCOUNTER — Other Ambulatory Visit: Payer: Self-pay

## 2024-02-19 ENCOUNTER — Telehealth: Payer: Self-pay

## 2024-02-19 DIAGNOSIS — J302 Other seasonal allergic rhinitis: Secondary | ICD-10-CM

## 2024-02-19 DIAGNOSIS — J4520 Mild intermittent asthma, uncomplicated: Secondary | ICD-10-CM

## 2024-02-19 MED ORDER — ALBUTEROL SULFATE HFA 108 (90 BASE) MCG/ACT IN AERS: 2.0000 | INHALATION_SPRAY | Freq: Four times a day (QID) | RESPIRATORY_TRACT | 0 refills | Status: DC | PRN

## 2024-02-19 MED ORDER — QNASL 80 MCG/ACT NA AERS: INHALATION_SPRAY | NASAL | 2 refills | Status: AC

## 2024-02-19 NOTE — Progress Notes (Signed)
  error

## 2024-02-19 NOTE — Telephone Encounter (Signed)
 Patient calling she would like to try the oral tablet for acne Dr MARLA mentioned during her visit here, she declined oral tablets at the time but she would to try it.

## 2024-02-19 NOTE — Telephone Encounter (Signed)
LM on VM please return my call  

## 2024-02-19 NOTE — Telephone Encounter (Unsigned)
 Copied from CRM #[id number]. Topic: Clinical - Medication Refill >> Feb 19, 2024 11:57 AM Shereese L wrote: Medication: QNASL  80 MCG/ACT AERS albuterol  (VENTOLIN  HFA) 108 (90 Base) MCG/ACT inhaler  Has the patient contacted their pharmacy? Yes (Agent: If no, request that the patient contact the pharmacy for the refill. If patient does not wish to contact the pharmacy document the reason why and proceed with request.) (Agent: If yes, when and what did the pharmacy advise?)  This is the patient's preferred pharmacy:   [facility] - [place], [place] - [address] [place] [address] Phone: [phone] Fax: [phone]   Is this the correct pharmacy for this prescription? Yes If no, delete pharmacy and type the correct one.   Has the prescription been filled recently? Yes  Is the patient out of the medication? Yes  Has the patient been seen for an appointment in the last year OR does the patient have an upcoming appointment? Yes  Can we respond through MyChart? Yes  Agent: Please be advised that Rx refills may take up to 3 business days. We ask that you follow-up with your pharmacy.

## 2024-02-20 ENCOUNTER — Other Ambulatory Visit: Payer: Self-pay

## 2024-02-20 MED ORDER — SPIRONOLACTONE 25 MG PO TABS
ORAL_TABLET | ORAL | 3 refills | Status: AC
Start: 2024-02-20 — End: ?

## 2024-02-20 NOTE — Telephone Encounter (Signed)
 discussed oral Spironolactone which helps with hormonal acne.  Can take 25 mg qam disp #30 with 3rf.  See in 3 mos - make appt.  Spironolactone 25 mg #30 3 RF sent to [facility]

## 2024-03-05 ENCOUNTER — Ambulatory Visit
Admission: RE | Admit: 2024-03-05 | Discharge: 2024-03-05 | Disposition: A | Discharge status: Home / Self Care | Payer: Self-pay | Source: Ambulatory Visit | Attending: Primary Care | Admitting: Primary Care

## 2024-03-05 DIAGNOSIS — E785 Hyperlipidemia, unspecified: Secondary | ICD-10-CM | POA: Insufficient documentation

## 2024-03-20 ENCOUNTER — Telehealth: Payer: Self-pay

## 2024-03-20 NOTE — Telephone Encounter (Signed)
 Copied from CRM [id number]. Topic: Clinical - Medical Advice >> Mar 20, 2024 12:27 PM Anairis L wrote: Reason for CRM: Patient would like to see if NP Gretta will send her a prescription for predinose 20mg , for headache and sinus pressure.

## 2024-05-21 ENCOUNTER — Other Ambulatory Visit: Payer: Self-pay | Admitting: Primary Care

## 2024-05-21 DIAGNOSIS — J4520 Mild intermittent asthma, uncomplicated: Secondary | ICD-10-CM

## 2024-06-06 ENCOUNTER — Encounter: Payer: Self-pay | Admitting: Family Medicine

## 2024-06-06 ENCOUNTER — Ambulatory Visit: Payer: Self-pay

## 2024-06-06 ENCOUNTER — Ambulatory Visit: Admitting: Family Medicine

## 2024-06-06 VITALS — BP 126/84 | HR 59 | Temp 97.6°F | Ht 64.25 in | Wt 117.6 lb

## 2024-06-06 DIAGNOSIS — J0100 Acute maxillary sinusitis, unspecified: Secondary | ICD-10-CM

## 2024-06-06 MED ORDER — AMOXICILLIN 500 MG PO CAPS: 1000.0000 mg | ORAL_CAPSULE | Freq: Two times a day (BID) | ORAL | 0 refills | Status: AC

## 2024-06-06 MED ORDER — PREDNISONE 20 MG PO TABS: ORAL_TABLET | ORAL | 0 refills | Status: AC

## 2024-06-06 NOTE — Telephone Encounter (Signed)
 Noted. Agree with nursing triage decision. Appreciate Dr Sherrel evaluation.

## 2024-06-06 NOTE — Progress Notes (Signed)
 "   Patient ID: Kelli Snyder, female    DOB: 11-16-1960, 64 y.o.   MRN: [id number]  This visit was conducted in person.  BP 126/84 (Cuff Size: Normal)   Pulse (!) 59   Temp 97.6 F (36.4 C) (Oral)   Ht 5' 4.25 (1.632 m)   Wt 117 lb 9.6 oz (53.3 kg)   SpO2 98%   BMI 20.03 kg/m    CC:  Chief Complaint  Patient presents with   Acute Visit    Sudafed and Advil and afrin head sinus pressure getting worse eyes are a little swollen fatigue green mucus chronic sinus issues  Onset - 1.5 week  Tested negative for covid at home 1/25    Subjective:   HPI: Kelli Snyder is a 64 y.o. female presenting on 06/06/2024 for Acute Visit (Sudafed and Advil and afrin/head sinus pressure getting worse/eyes are a little swollen/fatigue/green mucus/chronic sinus issues /Onset - 1.5 week//Tested negative for covid at home 1/25)  PCP : Gretta   Had viral URI after christmas.. lasted a long time.  Date of onset:  1.5 weeks ago Initial symptoms included nasal congestion Symptoms progressed to worsening sinus pressure ( overa maxillary sinus, mainly on left, bilateral eyes mildly swollen, fatigue, nasal discharge changed to green mucus  New fever in last 4 days    Sick contacts:  none COVID testing:   1/25     She has tried to treat with Advil  sudafed and Afrin  Uses Qnasl  but has been out in last 2 week.  Netty pot saline.     No history of chronic lung disease such as asthma or COPD. She has a history of chronic sinus issues and seasonal allergies Non-smoker.  Allergic to sulfa      Relevant past medical, surgical, family and social history reviewed and updated as indicated. Interim medical history since our last visit reviewed. Allergies and medications reviewed and updated. Outpatient Medications Prior to Visit  Medication Sig Dispense Refill   albuterol  (VENTOLIN  HFA) 108 (90 Base) MCG/ACT inhaler USE 2 INHALATIONS EVERY 6 HOURS AS NEEDED FOR WHEEZING OR SHORTNESS OF BREATH 8.5  g 0   EPINEPHrine  0.3 mg/0.3 mL IJ SOAJ injection Inject 0.3 mg into the muscle as needed for anaphylaxis. 1 each 0   QNASL  80 MCG/ACT AERS USE 1 SPRAY NASALLY DAILY AS NEEDED 31.8 g 2   traZODone  (DESYREL ) 50 MG tablet Take 1 tablet (50 mg total) by mouth at bedtime. For sleep (Patient taking differently: Take 25 mg by mouth at bedtime. For sleep) 90 tablet 3   clindamycin  (CLINDAGEL) 1 % gel Apply to acne on face twice a day (Patient not taking: Reported on 06/06/2024) 30 g 11   levocetirizine (XYZAL ) 5 MG tablet Take 1 tablet (5 mg total) by mouth every evening. (Patient not taking: Reported on 06/06/2024) 30 tablet 0   spironolactone  (ALDACTONE ) 25 MG tablet Take 1 tablet once a day (Patient not taking: Reported on 06/06/2024) 30 tablet 3   No facility-administered medications prior to visit.     Per HPI unless specifically indicated in ROS section below Review of Systems  Constitutional:  Positive for fatigue and fever.  HENT:  Positive for congestion, sinus pressure and sinus pain.   Eyes:  Negative for pain.  Respiratory:  Negative for cough and shortness of breath.   Cardiovascular:  Negative for chest pain, palpitations and leg swelling.  Gastrointestinal:  Negative for abdominal pain.  Genitourinary:  Negative for dysuria and  vaginal bleeding.  Musculoskeletal:  Negative for back pain.  Neurological:  Negative for syncope, light-headedness and headaches.  Psychiatric/Behavioral:  Negative for dysphoric mood.    Objective:  BP 126/84 (Cuff Size: Normal)   Pulse (!) 59   Temp 97.6 F (36.4 C) (Oral)   Ht 5' 4.25 (1.632 m)   Wt 117 lb 9.6 oz (53.3 kg)   SpO2 98%   BMI 20.03 kg/m   Wt Readings from Last 3 Encounters:  06/06/24 117 lb 9.6 oz (53.3 kg)  11/16/23 117 lb 2 oz (53.1 kg)  08/23/23 119 lb 12.8 oz (54.3 kg)      Physical Exam Constitutional:      General: She is not in acute distress.    Appearance: Normal appearance. She is well-developed. She is not  ill-appearing or toxic-appearing.  HENT:     Head: Normocephalic.     Right Ear: Hearing, tympanic membrane, ear canal and external ear normal. Tympanic membrane is not erythematous, retracted or bulging.     Left Ear: Hearing, tympanic membrane, ear canal and external ear normal. Tympanic membrane is not erythematous, retracted or bulging.     Nose: Mucosal edema and congestion present. No rhinorrhea.     Right Sinus: No maxillary sinus tenderness or frontal sinus tenderness.     Left Sinus: Maxillary sinus tenderness present. No frontal sinus tenderness.     Mouth/Throat:     Lips: Pink.     Mouth: Mucous membranes are moist.     Tongue: No lesions.     Pharynx: Oropharynx is clear. Uvula midline. No oropharyngeal exudate.     Tonsils: No tonsillar exudate or tonsillar abscesses.  Eyes:     General: Lids are normal. Lids are everted, no foreign bodies appreciated.     Conjunctiva/sclera: Conjunctivae normal.     Pupils: Pupils are equal, round, and reactive to light.  Neck:     Thyroid: No thyroid mass or thyromegaly.     Vascular: No carotid bruit.     Trachea: Trachea normal.  Cardiovascular:     Rate and Rhythm: Normal rate and regular rhythm.     Pulses: Normal pulses.     Heart sounds: Normal heart sounds, S1 normal and S2 normal. No murmur heard.    No friction rub. No gallop.  Pulmonary:     Effort: Pulmonary effort is normal. No tachypnea or respiratory distress.     Breath sounds: Normal breath sounds. No decreased breath sounds, wheezing, rhonchi or rales.  Musculoskeletal:     Cervical back: Normal range of motion and neck supple.  Skin:    General: Skin is warm and dry.     Findings: No rash.  Neurological:     Mental Status: She is alert.  Psychiatric:        Mood and Affect: Mood is not anxious or depressed.        Speech: Speech normal.        Behavior: Behavior normal. Behavior is cooperative.        Thought Content: Thought content normal.        Judgment:  Judgment normal.       Results for orders placed or performed in visit on 11/16/23  Lipid panel   Collection Time: 11/16/23  3:18 PM  Result Value Ref Range   Cholesterol 277 (H) 0 - 200 mg/dL   Triglycerides 11.9 0.0 - 149.0 mg/dL   HDL 898.19 >60.99 mg/dL   VLDL 82.3 0.0 - 59.9 mg/dL  LDL Cholesterol 158 (H) 0 - 99 mg/dL   Total CHOL/HDL Ratio 3    NonHDL 175.49   Hemoglobin A1c   Collection Time: 11/16/23  3:18 PM  Result Value Ref Range   Hgb A1c MFr Bld 6.2 4.6 - 6.5 %  Comprehensive metabolic panel with GFR   Collection Time: 11/16/23  3:18 PM  Result Value Ref Range   Sodium 138 135 - 145 mEq/L   Potassium 4.2 3.5 - 5.1 mEq/L   Chloride 102 96 - 112 mEq/L   CO2 28 19 - 32 mEq/L   Glucose, Bld 92 70 - 99 mg/dL   BUN 23 6 - 23 mg/dL   Creatinine, Ser 8.90 0.40 - 1.20 mg/dL   Total Bilirubin 0.5 0.2 - 1.2 mg/dL   Alkaline Phosphatase 58 39 - 117 U/L   AST 29 0 - 37 U/L   ALT 18 0 - 35 U/L   Total Protein 6.4 6.0 - 8.3 g/dL   Albumin 4.5 3.5 - 5.2 g/dL   GFR 44.83 (L) >39.99 mL/min   Calcium 9.5 8.4 - 10.5 mg/dL    Assessment and Plan  Acute non-recurrent maxillary sinusitis Assessment & Plan: Acute, likely initial viral infection now with sign of bacterial superinfection given new fever, unilateral facial pain and purulent mucus. Will treat with prednisone  taper to alleviate pressure.  Once this is completed she can restart her Qnasl . Continue nasal saline irrigation and consider decongestant as needed. Complete course of amoxicillin  500 mg 2 tablets twice a day for 10 days.  Expect improvement in the next 48 to 72 hours.  If not improving towards end of antibiotic course or fever on the antibiotics or intolerance to antibiotics, call for additional recommendations. Return and ER precautions provided.   Other orders -     predniSONE ; 3 tabs by mouth daily x 3 days, then 2 tabs by mouth daily x 2 days then 1 tab by mouth daily x 2 days  Dispense: 15 tablet;  Refill: 0 -     Amoxicillin ; Take 2 capsules (1,000 mg total) by mouth 2 (two) times daily.  Dispense: 40 capsule; Refill: 0    No follow-ups on file.   Greig Ring, MD  "

## 2024-06-06 NOTE — Telephone Encounter (Signed)
 FYI Only or Action Required?: FYI only for provider: appointment scheduled on 06/06/24 0940.  Patient was last seen in primary care on 11/16/2023 by Gretta Comer POUR, NP.  Called Nurse Triage reporting URI.  Symptoms began a week ago.  Interventions attempted: OTC medications: Sudafed and Advil.  Symptoms are: unchanged.  Triage Disposition: See Physician Within 24 Hours  Patient/caregiver understands and will follow disposition?: Yes  Message from [facility] sent at 06/06/2024  8:53 AM EST  Summary: head sinus pressure getting worse,chronic sinus issues   Reason for Triage: head sinus pressure getting worse eyes are a little swollen fatigue green mucus chronic sinus issues         Reason for Disposition  [1] Sinus pain (not just congestion) AND [2] fever  Answer Assessment - Initial Assessment Questions Sudafed and Advil for sx   1. LOCATION: Where does it hurt?      Head sinus pain  2. ONSET: When did the sinus pain start?  (e.g., hours, days)      1 week  3. SEVERITY: How bad is the pain?   (Scale 0-10; or none, mild, moderate or severe)     Mild/moderate  4. RECURRENT SYMPTOM: Have you ever had sinus problems before? If Yes, ask: When was the last time? and What happened that time?      Ongoing since Christmas but got worse  5. NASAL CONGESTION: Is the nose blocked? If Yes, ask: Can you open it or must you breathe through your mouth?     yes 7. FEVER: Do you have a fever? If Yes, ask: What is it, how was it measured, and when did it start?     Low grade  8. OTHER SYMPTOMS: Do you have any other symptoms? (e.g., sore throat, cough, earache, difficulty breathing)     Cough, eyes puffy, fatigue, mucus green,  Protocols used: Sinus Pain or Congestion-A-AH

## 2024-06-06 NOTE — Assessment & Plan Note (Signed)
 Acute, likely initial viral infection now with sign of bacterial superinfection given new fever, unilateral facial pain and purulent mucus. Will treat with prednisone  taper to alleviate pressure.  Once this is completed she can restart her Qnasl . Continue nasal saline irrigation and consider decongestant as needed. Complete course of amoxicillin  500 mg 2 tablets twice a day for 10 days.  Expect improvement in the next 48 to 72 hours.  If not improving towards end of antibiotic course or fever on the antibiotics or intolerance to antibiotics, call for additional recommendations. Return and ER precautions provided.

## 2024-06-11 ENCOUNTER — Ambulatory Visit: Admitting: Dermatology

## 2025-02-04 ENCOUNTER — Ambulatory Visit: Admitting: Dermatology
